# Patient Record
Sex: Male | Born: 1955 | Race: White | Hispanic: No | Smoking: Current every day smoker
Health system: Southern US, Community
[De-identification: ages and names within clinical notes are randomized; demographics above are authoritative.]

## PROBLEM LIST (undated history)

## (undated) DIAGNOSIS — F17200 Nicotine dependence, unspecified, uncomplicated: Secondary | ICD-10-CM

## (undated) DIAGNOSIS — K5792 Diverticulitis of intestine, part unspecified, without perforation or abscess without bleeding: Secondary | ICD-10-CM

## (undated) DIAGNOSIS — Z87442 Personal history of urinary calculi: Secondary | ICD-10-CM

## (undated) DIAGNOSIS — G4730 Sleep apnea, unspecified: Secondary | ICD-10-CM

## (undated) DIAGNOSIS — E7800 Pure hypercholesterolemia, unspecified: Secondary | ICD-10-CM

## (undated) DIAGNOSIS — I10 Essential (primary) hypertension: Secondary | ICD-10-CM

## (undated) DIAGNOSIS — M1990 Unspecified osteoarthritis, unspecified site: Secondary | ICD-10-CM

## (undated) DIAGNOSIS — J449 Chronic obstructive pulmonary disease, unspecified: Secondary | ICD-10-CM

## (undated) HISTORY — PX: TONSILLECTOMY: SUR1361

## (undated) HISTORY — PX: HIP SURGERY: SHX245

## (undated) HISTORY — PX: NASAL SEPTUM SURGERY: SHX37

---

## 2007-10-01 ENCOUNTER — Emergency Department (HOSPITAL_COMMUNITY): Admission: EM | Admit: 2007-10-01 | Discharge: 2007-10-01 | Payer: Self-pay | Admitting: Emergency Medicine

## 2011-03-22 ENCOUNTER — Ambulatory Visit: Payer: Self-pay | Admitting: Family Medicine

## 2012-04-21 ENCOUNTER — Emergency Department (HOSPITAL_BASED_OUTPATIENT_CLINIC_OR_DEPARTMENT_OTHER)
Admission: EM | Admit: 2012-04-21 | Discharge: 2012-04-21 | Disposition: A | Discharge status: Home / Self Care | Payer: Worker's Compensation | Attending: Emergency Medicine | Admitting: Emergency Medicine

## 2012-04-21 ENCOUNTER — Encounter (HOSPITAL_BASED_OUTPATIENT_CLINIC_OR_DEPARTMENT_OTHER): Payer: Self-pay | Admitting: *Deleted

## 2012-04-21 DIAGNOSIS — S0101XA Laceration without foreign body of scalp, initial encounter: Secondary | ICD-10-CM

## 2012-04-21 DIAGNOSIS — IMO0002 Reserved for concepts with insufficient information to code with codable children: Secondary | ICD-10-CM | POA: Insufficient documentation

## 2012-04-21 DIAGNOSIS — Z23 Encounter for immunization: Secondary | ICD-10-CM | POA: Insufficient documentation

## 2012-04-21 DIAGNOSIS — S0100XA Unspecified open wound of scalp, initial encounter: Secondary | ICD-10-CM | POA: Insufficient documentation

## 2012-04-21 MED ORDER — TETANUS-DIPHTH-ACELL PERTUSSIS 5-2.5-18.5 LF-MCG/0.5 IM SUSP
0.5000 mL | Freq: Once | INTRAMUSCULAR | Status: AC
  Administered 2012-04-21: 0.5 mL via INTRAMUSCULAR
  Filled 2012-04-21: qty 0.5

## 2012-04-21 NOTE — ED Notes (Signed)
EMS states patient was at work and hit his head on a gear box door of an airplane.  No loc, no fall.

## 2012-04-21 NOTE — ED Notes (Signed)
Pt states he was walking under airplane and struck head-laceration noted-no LOC

## 2012-04-21 NOTE — ED Provider Notes (Signed)
Medical screening examination/treatment/procedure(s) were performed by non-physician practitioner and as supervising physician I was immediately available for consultation/collaboration.   Gerhard Munch, MD 04/21/12 1904

## 2012-04-21 NOTE — ED Provider Notes (Signed)
History     CSN: [id number]  Arrival date & time 04/21/12  1625   First MD Initiated Contact with Patient 04/21/12 1633      Chief Complaint  Patient presents with  . Head Laceration    (Consider location/radiation/quality/duration/timing/severity/associated sxs/prior treatment) Patient is a 56 y.o. male presenting with scalp laceration. The history is provided by the patient. No language interpreter was used.  Head Laceration This is a new problem. The current episode started today. The problem occurs constantly. Nothing aggravates the symptoms. He has tried nothing for the symptoms. The treatment provided no relief.   Pt complains of a laceration to his scalp.  Pt hit his head on an airplane Past Medical History  Diagnosis Date  . Diabetes mellitus     Past Surgical History  Procedure Date  . Hip surgery   . Tonsillectomy   . Nasal septum surgery     No family history on file.  History  Substance Use Topics  . Smoking status: Current Everyday Smoker  . Smokeless tobacco: Not on file  . Alcohol Use: No      Review of Systems  Skin: Positive for wound.  All other systems reviewed and are negative.    Allergies  Review of patient's allergies indicates no known allergies.  Home Medications  No current outpatient prescriptions on file.  BP 162/87  Pulse 95  Temp 98.7 F (37.1 C) (Oral)  Resp 20  Ht 6\' 1"  (1.854 m)  Wt 328 lb (148.78 kg)  BMI 43.27 kg/m2  SpO2 97%  Physical Exam  Nursing note and vitals reviewed. Constitutional: He is oriented to person, place, and time. He appears well-developed and well-nourished.  HENT:  Right Ear: External ear normal.  Left Ear: External ear normal.  Nose: Nose normal.  Mouth/Throat: Oropharynx is clear and moist.       8cm laceration scalp  Eyes: Conjunctivae and EOM are normal. Pupils are equal, round, and reactive to light.  Neck: Normal range of motion. Neck supple.  Cardiovascular: Normal rate.     Pulmonary/Chest: Effort normal.  Abdominal: Soft.  Neurological: He is alert and oriented to person, place, and time.  Skin: Skin is warm.    ED Course  LACERATION REPAIR Performed by: Elson Areas Authorized by: Elson Areas Consent: Verbal consent not obtained. Risks and benefits: risks, benefits and alternatives were discussed Consent given by: patient Patient understanding: patient states understanding of the procedure being performed Anesthesia: local infiltration Irrigation solution: saline Amount of cleaning: standard Debridement: none Degree of undermining: none Skin closure: staples Number of sutures: 6   (including critical care time)  Labs Reviewed - No data to display No results found.   1. Laceration of scalp       MDM  Pt given tetanus,  I advised staple removal in 8 days        Lonia Skinner [place], [place] 04/21/12 1737

## 2014-11-15 DIAGNOSIS — Z7185 Encounter for immunization safety counseling: Secondary | ICD-10-CM | POA: Insufficient documentation

## 2014-11-15 DIAGNOSIS — Z7189 Other specified counseling: Secondary | ICD-10-CM | POA: Insufficient documentation

## 2016-02-01 ENCOUNTER — Encounter
Admission: RE | Admit: 2016-02-01 | Discharge: 2016-02-01 | Disposition: A | Discharge status: Home / Self Care | Source: Ambulatory Visit | Attending: Orthopedic Surgery | Admitting: Orthopedic Surgery

## 2016-02-01 DIAGNOSIS — Z01812 Encounter for preprocedural laboratory examination: Secondary | ICD-10-CM | POA: Diagnosis present

## 2016-02-01 DIAGNOSIS — I10 Essential (primary) hypertension: Secondary | ICD-10-CM

## 2016-02-01 DIAGNOSIS — M1611 Unilateral primary osteoarthritis, right hip: Secondary | ICD-10-CM | POA: Diagnosis present

## 2016-02-01 DIAGNOSIS — Z01810 Encounter for preprocedural cardiovascular examination: Secondary | ICD-10-CM | POA: Diagnosis present

## 2016-02-01 HISTORY — DX: Unspecified osteoarthritis, unspecified site: M19.90

## 2016-02-01 HISTORY — DX: Essential (primary) hypertension: I10

## 2016-02-01 HISTORY — DX: Sleep apnea, unspecified: G47.30

## 2016-02-01 LAB — URINALYSIS COMPLETE WITH MICROSCOPIC (ARMC ONLY)
BACTERIA UA: NONE SEEN
Bilirubin Urine: NEGATIVE
Glucose, UA: NEGATIVE mg/dL
Hgb urine dipstick: NEGATIVE
Ketones, ur: NEGATIVE mg/dL
Leukocytes, UA: NEGATIVE
NITRITE: NEGATIVE
PROTEIN: NEGATIVE mg/dL
SPECIFIC GRAVITY, URINE: 1.02 (ref 1.005–1.030)
SQUAMOUS EPITHELIAL / LPF: NONE SEEN
pH: 5 (ref 5.0–8.0)

## 2016-02-01 LAB — SURGICAL PCR SCREEN
MRSA, PCR: NEGATIVE
Staphylococcus aureus: NEGATIVE

## 2016-02-01 LAB — SEDIMENTATION RATE: Sed Rate: 20 mm/hr (ref 0–20)

## 2016-02-01 LAB — TYPE AND SCREEN
ABO/RH(D): O POS
ANTIBODY SCREEN: NEGATIVE

## 2016-02-01 LAB — PROTIME-INR
INR: 0.96
Prothrombin Time: 13 seconds (ref 11.4–15.0)

## 2016-02-01 LAB — ABO/RH: ABO/RH(D): O POS

## 2016-02-01 LAB — APTT: APTT: 30 s (ref 24–36)

## 2016-02-01 NOTE — Patient Instructions (Signed)
  Your procedure is scheduled on: 02/14/16 Tues Report to Same Day Surgery 2nd floor medical mall To find out your arrival time please call [phone] between 1PM - 3PM on 02/13/16 Mon  Remember: Instructions that are not followed completely may result in serious medical risk, up to and including death, or upon the discretion of your surgeon and anesthesiologist your surgery may need to be rescheduled.    _x___ 1. Do not eat food or drink liquids after midnight. No gum chewing or hard candies.     __x__ 2. No Alcohol for 24 hours before or after surgery.   ____ 3. Bring all medications with you on the day of surgery if instructed.    __x__ 4. Notify your doctor if there is any change in your medical condition     (cold, fever, infections).     Do not wear jewelry, make-up, hairpins, clips or nail polish.  Do not wear lotions, powders, or perfumes. You may wear deodorant.  Do not shave 48 hours prior to surgery. Men may shave face and neck.  Do not bring valuables to the hospital.    [facility] not responsible for any belongings or valuables.               Contacts, dentures or bridgework may not be worn into surgery.  Leave your suitcase in the car. After surgery it may be brought to your room.  For patients admitted to the hospital, discharge time is determined by your treatment team.   Patients discharged the day of surgery will not be allowed to drive home.    Please read over the following fact sheets that you were given:   [facility] Preparing for Surgery and or MRSA Information   _x___ Take these medicines the morning of surgery with A SIP OF WATER:    1.   2.  3.  4.  5.  6.  ____ Fleet Enema (as directed)   _x___ Use CHG Soap or sage wipes as directed on instruction sheet   ____ Use inhalers on the day of surgery and bring to hospital day of surgery  ____ Stop metformin 2 days prior to surgery    ____ Take 1/2 of usual insulin dose the night before  surgery and none on the morning of           surgery.   ____ Stop aspirin or coumadin, or plavix  _x__ Stop Anti-inflammatories such as Advil, Aleve, Ibuprofen, Motrin, Naproxen,          Naprosyn, Goodies powders or aspirin products. Ok to take Tylenol.   ____ Stop supplements until after surgery.    ____ Bring C-Pap to the hospital.

## 2016-02-03 LAB — URINE CULTURE

## 2016-02-06 NOTE — Pre-Procedure Instructions (Addendum)
URINE CULTURE RESULTS FAXED TO DR MENZ NO RECOLLECTION NEEDED BY DR [facility]

## 2016-02-14 ENCOUNTER — Encounter: Payer: Self-pay | Admitting: *Deleted

## 2016-02-14 ENCOUNTER — Inpatient Hospital Stay: Admitting: Anesthesiology

## 2016-02-14 ENCOUNTER — Encounter
Admission: RE | Disposition: A | Discharge status: Home Health | Payer: Self-pay | Source: Ambulatory Visit | Attending: Orthopedic Surgery

## 2016-02-14 ENCOUNTER — Inpatient Hospital Stay

## 2016-02-14 ENCOUNTER — Inpatient Hospital Stay
Admission: RE | Admit: 2016-02-14 | Discharge: 2016-02-17 | DRG: 470 | Disposition: A | Discharge status: Home Health | Source: Ambulatory Visit | Attending: Orthopedic Surgery | Admitting: Orthopedic Surgery

## 2016-02-14 DIAGNOSIS — F17210 Nicotine dependence, cigarettes, uncomplicated: Secondary | ICD-10-CM | POA: Diagnosis present

## 2016-02-14 DIAGNOSIS — Z888 Allergy status to other drugs, medicaments and biological substances status: Secondary | ICD-10-CM | POA: Diagnosis not present

## 2016-02-14 DIAGNOSIS — Z419 Encounter for procedure for purposes other than remedying health state, unspecified: Secondary | ICD-10-CM

## 2016-02-14 DIAGNOSIS — Z7982 Long term (current) use of aspirin: Secondary | ICD-10-CM

## 2016-02-14 DIAGNOSIS — G4730 Sleep apnea, unspecified: Secondary | ICD-10-CM | POA: Diagnosis present

## 2016-02-14 DIAGNOSIS — M1611 Unilateral primary osteoarthritis, right hip: Secondary | ICD-10-CM | POA: Diagnosis present

## 2016-02-14 DIAGNOSIS — G8918 Other acute postprocedural pain: Secondary | ICD-10-CM

## 2016-02-14 DIAGNOSIS — E669 Obesity, unspecified: Secondary | ICD-10-CM | POA: Diagnosis present

## 2016-02-14 DIAGNOSIS — Z6841 Body Mass Index (BMI) 40.0 and over, adult: Secondary | ICD-10-CM | POA: Diagnosis not present

## 2016-02-14 DIAGNOSIS — Z79899 Other long term (current) drug therapy: Secondary | ICD-10-CM

## 2016-02-14 DIAGNOSIS — I10 Essential (primary) hypertension: Secondary | ICD-10-CM | POA: Diagnosis present

## 2016-02-14 DIAGNOSIS — E119 Type 2 diabetes mellitus without complications: Secondary | ICD-10-CM | POA: Diagnosis present

## 2016-02-14 DIAGNOSIS — D62 Acute posthemorrhagic anemia: Secondary | ICD-10-CM | POA: Diagnosis not present

## 2016-02-14 HISTORY — PX: TOTAL HIP ARTHROPLASTY: SHX124

## 2016-02-14 HISTORY — PX: JOINT REPLACEMENT: SHX530

## 2016-02-14 LAB — CBC
HEMATOCRIT: 37.4 % — AB (ref 40.0–52.0)
HEMOGLOBIN: 12.1 g/dL — AB (ref 13.0–18.0)
MCH: 28.5 pg (ref 26.0–34.0)
MCHC: 32.5 g/dL (ref 32.0–36.0)
MCV: 87.8 fL (ref 80.0–100.0)
Platelets: 265 10*3/uL (ref 150–440)
RBC: 4.26 MIL/uL — ABNORMAL LOW (ref 4.40–5.90)
RDW: 14.5 % (ref 11.5–14.5)
WBC: 15.7 10*3/uL — AB (ref 3.8–10.6)

## 2016-02-14 LAB — GLUCOSE, CAPILLARY: Glucose-Capillary: 94 mg/dL (ref 65–99)

## 2016-02-14 LAB — CREATININE, SERUM: Creatinine, Ser: 0.77 mg/dL (ref 0.61–1.24)

## 2016-02-14 SURGERY — ARTHROPLASTY, HIP, TOTAL, ANTERIOR APPROACH
Anesthesia: Spinal | Site: Hip | Laterality: Right | Wound class: Clean

## 2016-02-14 MED ORDER — TRANEXAMIC ACID 1000 MG/10ML IV SOLN
1500.0000 mg | INTRAVENOUS | Status: AC
  Administered 2016-02-14: 1500 mg via INTRAVENOUS
  Filled 2016-02-14: qty 15

## 2016-02-14 MED ORDER — ONDANSETRON HCL 4 MG/2ML IJ SOLN: 4.0000 mg | Freq: Four times a day (QID) | INTRAMUSCULAR | Status: DC | PRN

## 2016-02-14 MED ORDER — METOCLOPRAMIDE HCL 5 MG/ML IJ SOLN: 5.0000 mg | Freq: Three times a day (TID) | INTRAMUSCULAR | Status: DC | PRN

## 2016-02-14 MED ORDER — DEXTROSE 5 % IV SOLN
3.0000 g | Freq: Four times a day (QID) | INTRAVENOUS | Status: AC
  Administered 2016-02-14 – 2016-02-15 (×3): 3 g via INTRAVENOUS
  Filled 2016-02-14 (×3): qty 3000

## 2016-02-14 MED ORDER — PHENOL 1.4 % MT LIQD
1.0000 | OROMUCOSAL | Status: DC | PRN
  Filled 2016-02-14: qty 177

## 2016-02-14 MED ORDER — METHOCARBAMOL 1000 MG/10ML IJ SOLN
500.0000 mg | Freq: Four times a day (QID) | INTRAMUSCULAR | Status: DC | PRN
  Filled 2016-02-14: qty 5

## 2016-02-14 MED ORDER — LOSARTAN POTASSIUM 50 MG PO TABS
100.0000 mg | ORAL_TABLET | Freq: Every day | ORAL | Status: DC
  Administered 2016-02-14 – 2016-02-16 (×3): 100 mg via ORAL
  Filled 2016-02-14 (×3): qty 2

## 2016-02-14 MED ORDER — MAGNESIUM HYDROXIDE 400 MG/5ML PO SUSP
30.0000 mL | Freq: Every day | ORAL | Status: DC | PRN
  Administered 2016-02-15 – 2016-02-17 (×2): 30 mL via ORAL
  Filled 2016-02-14 (×2): qty 30

## 2016-02-14 MED ORDER — BUPIVACAINE HCL (PF) 0.5 % IJ SOLN
INTRAMUSCULAR | Status: DC | PRN
  Administered 2016-02-14: 3 mL via INTRATHECAL

## 2016-02-14 MED ORDER — PROPOFOL 10 MG/ML IV BOLUS
INTRAVENOUS | Status: DC | PRN
Start: 2016-02-14 — End: 2016-02-14
  Administered 2016-02-14: 90 mg via INTRAVENOUS

## 2016-02-14 MED ORDER — FENTANYL CITRATE (PF) 100 MCG/2ML IJ SOLN: 25.0000 ug | INTRAMUSCULAR | Status: DC | PRN

## 2016-02-14 MED ORDER — ENOXAPARIN SODIUM 40 MG/0.4ML ~~LOC~~ SOLN: 40.0000 mg | SUBCUTANEOUS | Status: DC

## 2016-02-14 MED ORDER — ZOLPIDEM TARTRATE 5 MG PO TABS: 5.0000 mg | ORAL_TABLET | Freq: Every evening | ORAL | Status: DC | PRN

## 2016-02-14 MED ORDER — MIDAZOLAM HCL 5 MG/5ML IJ SOLN
INTRAMUSCULAR | Status: DC | PRN
  Administered 2016-02-14 (×2): 1 mg via INTRAVENOUS

## 2016-02-14 MED ORDER — NEOMYCIN-POLYMYXIN B GU 40-200000 IR SOLN
Status: AC
  Filled 2016-02-14: qty 4

## 2016-02-14 MED ORDER — MORPHINE SULFATE (PF) 2 MG/ML IV SOLN
2.0000 mg | INTRAVENOUS | Status: DC | PRN
  Administered 2016-02-14 (×3): 2 mg via INTRAVENOUS
  Filled 2016-02-14 (×3): qty 1

## 2016-02-14 MED ORDER — OXYCODONE HCL 5 MG PO TABS
5.0000 mg | ORAL_TABLET | ORAL | Status: DC | PRN
  Administered 2016-02-14: 5 mg via ORAL
  Administered 2016-02-14: 10 mg via ORAL
  Administered 2016-02-14: 5 mg via ORAL
  Administered 2016-02-15 – 2016-02-17 (×10): 10 mg via ORAL
  Filled 2016-02-14 (×6): qty 2
  Filled 2016-02-14: qty 1
  Filled 2016-02-14 (×4): qty 2
  Filled 2016-02-14: qty 1
  Filled 2016-02-14: qty 2

## 2016-02-14 MED ORDER — ONDANSETRON HCL 4 MG PO TABS: 4.0000 mg | ORAL_TABLET | Freq: Four times a day (QID) | ORAL | Status: DC | PRN

## 2016-02-14 MED ORDER — DIPHENHYDRAMINE HCL 12.5 MG/5ML PO ELIX
12.5000 mg | ORAL_SOLUTION | ORAL | Status: DC | PRN
  Filled 2016-02-14: qty 10

## 2016-02-14 MED ORDER — EVICEL 5 ML EX KIT
PACK | CUTANEOUS | Status: DC | PRN
  Administered 2016-02-14: 5 mL

## 2016-02-14 MED ORDER — FAMOTIDINE 20 MG PO TABS
20.0000 mg | ORAL_TABLET | Freq: Once | ORAL | Status: AC
  Administered 2016-02-14: 20 mg via ORAL

## 2016-02-14 MED ORDER — CEFAZOLIN SODIUM-DEXTROSE 2-4 GM/100ML-% IV SOLN
INTRAVENOUS | Status: AC
  Filled 2016-02-14: qty 100

## 2016-02-14 MED ORDER — PROPOFOL 500 MG/50ML IV EMUL
INTRAVENOUS | Status: DC | PRN
  Administered 2016-02-14: 100 ug/kg/min via INTRAVENOUS

## 2016-02-14 MED ORDER — BUPIVACAINE-EPINEPHRINE 0.25% -1:200000 IJ SOLN
INTRAMUSCULAR | Status: DC | PRN
  Administered 2016-02-14: 30 mL

## 2016-02-14 MED ORDER — SODIUM CHLORIDE 0.9 % IV SOLN
INTRAVENOUS | Status: DC
  Administered 2016-02-14 (×2): via INTRAVENOUS

## 2016-02-14 MED ORDER — PHENYLEPHRINE HCL 10 MG/ML IJ SOLN
INTRAMUSCULAR | Status: DC | PRN
  Administered 2016-02-14 (×5): 100 ug via INTRAVENOUS

## 2016-02-14 MED ORDER — MENTHOL 3 MG MT LOZG
1.0000 | LOZENGE | OROMUCOSAL | Status: DC | PRN
  Filled 2016-02-14: qty 9

## 2016-02-14 MED ORDER — SODIUM CHLORIDE 0.9 % IV SOLN
INTRAVENOUS | Status: DC
  Administered 2016-02-14 (×2): via INTRAVENOUS

## 2016-02-14 MED ORDER — ONDANSETRON HCL 4 MG/2ML IJ SOLN
4.0000 mg | Freq: Once | INTRAMUSCULAR | Status: DC | PRN
Start: 2016-02-14 — End: 2016-02-14

## 2016-02-14 MED ORDER — EVICEL 5 ML EX KIT
PACK | CUTANEOUS | Status: AC
  Filled 2016-02-14: qty 1

## 2016-02-14 MED ORDER — ACETAMINOPHEN 650 MG RE SUPP: 650.0000 mg | Freq: Four times a day (QID) | RECTAL | Status: DC | PRN

## 2016-02-14 MED ORDER — CEFAZOLIN SODIUM-DEXTROSE 2-4 GM/100ML-% IV SOLN
2.0000 g | Freq: Once | INTRAVENOUS | Status: AC
  Administered 2016-02-14: 2 g via INTRAVENOUS

## 2016-02-14 MED ORDER — METHOCARBAMOL 500 MG PO TABS: 500.0000 mg | ORAL_TABLET | Freq: Four times a day (QID) | ORAL | Status: DC | PRN

## 2016-02-14 MED ORDER — DOCUSATE SODIUM 100 MG PO CAPS
100.0000 mg | ORAL_CAPSULE | Freq: Two times a day (BID) | ORAL | Status: DC
  Administered 2016-02-14 – 2016-02-17 (×8): 100 mg via ORAL
  Filled 2016-02-14 (×8): qty 1

## 2016-02-14 MED ORDER — EPINEPHRINE HCL 1 MG/ML IJ SOLN
INTRAMUSCULAR | Status: DC | PRN
  Administered 2016-02-14: .2 mg via INTRATHECAL

## 2016-02-14 MED ORDER — KETOROLAC TROMETHAMINE 30 MG/ML IJ SOLN
30.0000 mg | Freq: Once | INTRAMUSCULAR | Status: AC
  Administered 2016-02-14: 30 mg via INTRAVENOUS
  Filled 2016-02-14: qty 1

## 2016-02-14 MED ORDER — SODIUM CHLORIDE 0.9 % IV SOLN
1000.0000 mg | Freq: Once | INTRAVENOUS | Status: AC
  Administered 2016-02-14: 1000 mg via INTRAVENOUS
  Filled 2016-02-14: qty 10

## 2016-02-14 MED ORDER — ACETAMINOPHEN 325 MG PO TABS: 650.0000 mg | ORAL_TABLET | Freq: Four times a day (QID) | ORAL | Status: DC | PRN

## 2016-02-14 MED ORDER — BUPIVACAINE-EPINEPHRINE (PF) 0.25% -1:200000 IJ SOLN
INTRAMUSCULAR | Status: AC
  Filled 2016-02-14: qty 30

## 2016-02-14 MED ORDER — BISACODYL 5 MG PO TBEC: 5.0000 mg | DELAYED_RELEASE_TABLET | Freq: Every day | ORAL | Status: DC | PRN

## 2016-02-14 MED ORDER — FAMOTIDINE 20 MG PO TABS
ORAL_TABLET | ORAL | Status: AC
  Filled 2016-02-14: qty 1

## 2016-02-14 MED ORDER — MAGNESIUM CITRATE PO SOLN
1.0000 | Freq: Once | ORAL | Status: DC | PRN
  Filled 2016-02-14: qty 296

## 2016-02-14 MED ORDER — HYDROMORPHONE HCL 1 MG/ML IJ SOLN
1.0000 mg | INTRAMUSCULAR | Status: DC | PRN
  Administered 2016-02-14: 2 mg via INTRAVENOUS
  Filled 2016-02-14 (×2): qty 2

## 2016-02-14 MED ORDER — METOCLOPRAMIDE HCL 10 MG PO TABS: 5.0000 mg | ORAL_TABLET | Freq: Three times a day (TID) | ORAL | Status: DC | PRN

## 2016-02-14 MED ORDER — ASPIRIN-ACETAMINOPHEN-CAFFEINE 250-250-65 MG PO TABS: 2.0000 | ORAL_TABLET | Freq: Four times a day (QID) | ORAL | Status: DC | PRN

## 2016-02-14 MED ORDER — ENOXAPARIN SODIUM 40 MG/0.4ML ~~LOC~~ SOLN
40.0000 mg | Freq: Two times a day (BID) | SUBCUTANEOUS | Status: DC
  Administered 2016-02-15 – 2016-02-17 (×5): 40 mg via SUBCUTANEOUS
  Filled 2016-02-14 (×6): qty 0.4

## 2016-02-14 MED ORDER — NEOMYCIN-POLYMYXIN B GU 40-200000 IR SOLN
Status: DC | PRN
  Administered 2016-02-14: 4 mL

## 2016-02-14 SURGICAL SUPPLY — 46 items
BLADE SAW SAG 18.5X105 (BLADE) ×2 IMPLANT
BNDG COHESIVE 6X5 TAN STRL LF (GAUZE/BANDAGES/DRESSINGS) ×4 IMPLANT
CANISTER SUCT 1200ML W/VALVE (MISCELLANEOUS) ×2 IMPLANT
CAPT HIP TOTAL 3 ×1 IMPLANT
CATH FOL LEG HOLDER (MISCELLANEOUS) ×2 IMPLANT
CATH TRAY METER 16FR LF (MISCELLANEOUS) ×2 IMPLANT
CHLORAPREP W/TINT 26ML (MISCELLANEOUS) ×2 IMPLANT
DRAPE C-ARM XRAY 36X54 (DRAPES) ×2 IMPLANT
DRAPE INCISE IOBAN 66X60 STRL (DRAPES) IMPLANT
DRAPE POUCH INSTRU U-SHP 10X18 (DRAPES) ×2 IMPLANT
DRAPE SHEET LG 3/4 BI-LAMINATE (DRAPES) ×6 IMPLANT
DRAPE STERI IOBAN 125X83 (DRAPES) ×2 IMPLANT
DRAPE TABLE BACK 80X90 (DRAPES) ×2 IMPLANT
DRSG OPSITE POSTOP 4X8 (GAUZE/BANDAGES/DRESSINGS) ×4 IMPLANT
ELECT BLADE 6.5 EXT (BLADE) ×2 IMPLANT
GAUZE SPONGE 4X4 12PLY STRL (GAUZE/BANDAGES/DRESSINGS) ×2 IMPLANT
GLOVE BIOGEL PI IND STRL 9 (GLOVE) ×1 IMPLANT
GLOVE BIOGEL PI INDICATOR 9 (GLOVE) ×1
GLOVE SURG ORTHO 9.0 STRL STRW (GLOVE) ×4 IMPLANT
GOWN STRL REUS W/ TWL LRG LVL3 (GOWN DISPOSABLE) ×1 IMPLANT
GOWN STRL REUS W/TWL LRG LVL3 (GOWN DISPOSABLE) ×2
GOWN SURG XXL (GOWNS) ×2 IMPLANT
HEMOVAC 400CC 10FR (MISCELLANEOUS) ×2 IMPLANT
HOOD PEEL AWAY FLYTE STAYCOOL (MISCELLANEOUS) ×2 IMPLANT
KIT PREVENA INCISION MGT 13 (CANNISTER) ×1 IMPLANT
MAT BLUE FLOOR 46X72 FLO (MISCELLANEOUS) ×2 IMPLANT
NDL SAFETY 18GX1.5 (NEEDLE) ×2 IMPLANT
NDL SPNL 18GX3.5 QUINCKE PK (NEEDLE) ×1 IMPLANT
NEEDLE SPNL 18GX3.5 QUINCKE PK (NEEDLE) ×2 IMPLANT
NS IRRIG 1000ML POUR BTL (IV SOLUTION) ×2 IMPLANT
PACK HIP COMPR (MISCELLANEOUS) ×2 IMPLANT
SOL PREP PVP 2OZ (MISCELLANEOUS) ×2
SOLUTION PREP PVP 2OZ (MISCELLANEOUS) ×1 IMPLANT
STAPLER SKIN PROX 35W (STAPLE) ×2 IMPLANT
STRAP SAFETY BODY (MISCELLANEOUS) ×2 IMPLANT
SUT DVC 2 QUILL PDO  T11 36X36 (SUTURE) ×1
SUT DVC 2 QUILL PDO T11 36X36 (SUTURE) ×1 IMPLANT
SUT DVC QUILL MONODERM 30X30 (SUTURE) ×2 IMPLANT
SUT SILK 0 (SUTURE) ×2
SUT SILK 0 30XBRD TIE 6 (SUTURE) ×1 IMPLANT
SUT VIC AB 1 CT1 36 (SUTURE) ×2 IMPLANT
SYR 20CC LL (SYRINGE) ×2 IMPLANT
SYR 30ML LL (SYRINGE) ×2 IMPLANT
TAPE MICROFOAM 4IN (TAPE) ×2 IMPLANT
TOWEL OR 17X26 4PK STRL BLUE (TOWEL DISPOSABLE) ×2 IMPLANT
TUBE KAMVAC SUCTION (TUBING) ×2 IMPLANT

## 2016-02-14 NOTE — Progress Notes (Signed)
Physical Therapy Treatment Patient Details Name: Jerome Cunningham MRN: [id number] DOB: 15-Oct-1955 Today's Date: 02/14/2016    History of Present Illness Pt underwent R THR anterior approach without reported post-op complications. He is POD#0 at time of initial evaluation    PT Comments    Pt demonstrates heavy assist for bed mobility. Once upright at EOB he is able to perform transfers and limited ambulation with CGA only. Pt ambulates very slow and requires close CGA due to size. Anticipate he will be appropriate to return home at [place] with [facility] PT. Pt will benefit from skilled PT services to address deficits in strength, balance, and mobility in order to return to full function at home.   Follow Up Recommendations  Home health PT     Equipment Recommendations  Rolling walker with 5" wheels    Recommendations for Other Services       Precautions / Restrictions Precautions Precautions: Anterior Hip Precaution Booklet Issued: Yes (comment) Restrictions Weight Bearing Restrictions: Yes RLE Weight Bearing: Weight bearing as tolerated    Mobility  Bed Mobility Overal bed mobility: Needs Assistance Bed Mobility: Supine to Sit     Supine to sit: Mod assist     General bed mobility comments: Pt requires heavy UE support and use of bed rail with assist from therapist to come to sitting.   Transfers Overall transfer level: Needs assistance Equipment used: Rolling walker (2 wheeled) Transfers: Sit to/from Stand Sit to Stand: Min guard         General transfer comment: Pt requires cues for proper hand placement and sequencing. He requires 2 attempts to remain standing. After first attempt pt becomes dizzy. Vitals obtained and are WNL. Pt is steady in standing with UE support  Ambulation/Gait Ambulation/Gait assistance: Min guard Ambulation Distance (Feet): 3 Feet Assistive device: Rolling walker (2 wheeled) Gait Pattern/deviations: Step-to pattern Gait velocity:  Decreased Gait velocity interpretation: Below normal speed for age/gender General Gait Details: Pt able to take short steps from bed to recliner with bariatric rolling walker. He moves very slowly and requires close guarding but no LE buckling noted. Pt reporting high levels of pain limiting his activity at this time   Stairs            Wheelchair Mobility    Modified Rankin (Stroke Patients Only)       Balance Overall balance assessment: Needs assistance Sitting-balance support: No upper extremity supported Sitting balance-Leahy Scale: Good     Standing balance support: Bilateral upper extremity supported Standing balance-Leahy Scale: Poor Standing balance comment: Requires heavy UE support at this time in standing                    Cognition Arousal/Alertness: Awake/alert Behavior During Therapy: WFL for tasks assessed/performed Overall Cognitive Status: Within Functional Limits for tasks assessed                      Exercises Total Joint Exercises Ankle Circles/Pumps: Strengthening;Both;10 reps;Supine Quad Sets: Strengthening;Both;10 reps;Supine Gluteal Sets: Strengthening;Both;10 reps;Supine Heel Slides: Strengthening;Right;10 reps;Supine Hip ABduction/ADduction: Strengthening;Right;10 reps;Supine Straight Leg Raises: Strengthening;Right;10 reps;Supine    General Comments        Pertinent Vitals/Pain Pain Assessment: 0-10 Pain Score: 7  Pain Location: R hip Pain Intervention(s): Monitored during session;Limited activity within patient's tolerance;Premedicated before session    Home Living Family/patient expects to be discharged to:: Private residence Living Arrangements: Spouse/significant other Available Help at Discharge: Family Type of Home: House Home Access: Stairs  to enter Entrance Stairs-Rails: Left Home Layout: One level Home Equipment: Cane - single point;Bedside commode      Prior Function Level of Independence:  Independent      Comments: Independent with ADLs/IADLs   PT Goals (current goals can now be found in the care plan section) Acute Rehab PT Goals Patient Stated Goal: Improve function at home PT Goal Formulation: With patient Time For Goal Achievement: 02/28/16 Potential to Achieve Goals: Good    Frequency  BID    PT Plan      Co-evaluation             End of Session Equipment Utilized During Treatment: Gait belt Activity Tolerance: Patient limited by pain Patient left: in chair;with call bell/phone within reach;with chair alarm set;with SCD's reapplied;Other (comment) (towel roll under heel)     Time: [id number] PT Time Calculation (min) (ACUTE ONLY): 30 min  Charges:  $Therapeutic Exercise: 8-22 mins                    G Codes:      Sharalyn InkJason D Huprich PT, DPT   Huprich,Jason 02/14/2016, 5:27 PM

## 2016-02-14 NOTE — NC FL2 (Signed)
[place] MEDICAID FL2 LEVEL OF CARE SCREENING TOOL     IDENTIFICATION  Patient Name: Jerome Cunningham Birthdate: 02/18/1956 Sex: male Admission Date (Current Location): 02/14/2016  [place]   Facility and Address:  [facility], [address], [place], [place] [address]      Provider Number: [id number]  Attending Physician Name and Address:  Kennedy BuckerMichael Menz, MD  Relative Name and Phone Number:       Current Level of Care: Hospital Recommended Level of Care: Skilled Nursing Facility Prior Approval Number:    Date Approved/Denied:   PASRR Number:  [id number])  Discharge Plan: SNF    Current Diagnoses: Patient Active Problem List   Diagnosis Date Noted  . Primary osteoarthritis of right hip 02/14/2016   Obesity, unspecified    Tobacco dependence    Sleep apnea  on CPAP  Borderline diabetes mellitus    Low testosterone    Osteoarthritis  moderate to severe   Hypertension, essential       Orientation RESPIRATION BLADDER Height & Weight     Self, Time, Situation, Place  Normal Continent Weight:   Height:     BEHAVIORAL SYMPTOMS/MOOD NEUROLOGICAL BOWEL NUTRITION STATUS   (none )  (none) Continent Diet (Diet: Clear Liquid )  AMBULATORY STATUS COMMUNICATION OF NEEDS Skin   Extensive Assist Verbally Surgical wounds, Wound Vac (Incision: Right Hip. )                       Personal Care Assistance Level of Assistance  Bathing, Feeding, Dressing Bathing Assistance: Limited assistance Feeding assistance: Independent Dressing Assistance: Limited assistance     Functional Limitations Info  Sight, Hearing, Speech Sight Info: Adequate Hearing Info: Adequate Speech Info: Adequate    SPECIAL CARE FACTORS FREQUENCY  PT (By licensed PT), OT (By licensed OT)     PT Frequency:  (5) OT Frequency:  (5)            Contractures      Additional Factors Info  Code Status, Allergies Code  Status Info:  (Not on file. ) Allergies Info:  (Tramadol )           Current Medications (02/14/2016):  This is the current hospital active medication list Current Facility-Administered Medications  Medication Dose Route Frequency Provider Last Rate Last Dose  . 0.9 %  sodium chloride infusion   Intravenous Continuous Kennedy BuckerMichael Menz, MD 100 mL/hr at 02/14/16 1140    . acetaminophen (TYLENOL) tablet 650 mg  650 mg Oral Q6H PRN Kennedy BuckerMichael Menz, MD       Or  . acetaminophen (TYLENOL) suppository 650 mg  650 mg Rectal Q6H PRN Kennedy BuckerMichael Menz, MD      . bisacodyl (DULCOLAX) EC tablet 5 mg  5 mg Oral Daily PRN Kennedy BuckerMichael Menz, MD      . ceFAZolin (ANCEF) 2-4 GM/100ML-% IVPB           . ceFAZolin (ANCEF) 3 g in dextrose 5 % 50 mL IVPB  3 g Intravenous Q6H Kennedy BuckerMichael Menz, MD   3 g at 02/14/16 1407  . diphenhydrAMINE (BENADRYL) 12.5 MG/5ML elixir 12.5-25 mg  12.5-25 mg Oral Q4H PRN Kennedy BuckerMichael Menz, MD      . docusate sodium (COLACE) capsule 100 mg  100 mg Oral BID Kennedy BuckerMichael Menz, MD      . Melene Muller[START ON 02/15/2016] enoxaparin (LOVENOX) injection 40 mg  40 mg Subcutaneous Q12H Kennedy BuckerMichael Menz, MD      .  famotidine (PEPCID) 20 MG tablet           . losartan (COZAAR) tablet 100 mg  100 mg Oral QHS Kennedy Bucker, MD      . magnesium citrate solution 1 Bottle  1 Bottle Oral Once PRN Kennedy Bucker, MD      . magnesium hydroxide (MILK OF MAGNESIA) suspension 30 mL  30 mL Oral Daily PRN Kennedy Bucker, MD      . menthol-cetylpyridinium (CEPACOL) lozenge 3 mg  1 lozenge Oral PRN Kennedy Bucker, MD       Or  . phenol (CHLORASEPTIC) mouth spray 1 spray  1 spray Mouth/Throat PRN Kennedy Bucker, MD      . methocarbamol (ROBAXIN) tablet 500 mg  500 mg Oral Q6H PRN Kennedy Bucker, MD       Or  . methocarbamol (ROBAXIN) 500 mg in dextrose 5 % 50 mL IVPB  500 mg Intravenous Q6H PRN Kennedy Bucker, MD      . metoCLOPramide (REGLAN) tablet 5-10 mg  5-10 mg Oral Q8H PRN Kennedy Bucker, MD       Or  . metoCLOPramide (REGLAN) injection 5-10 mg  5-10 mg  Intravenous Q8H PRN Kennedy Bucker, MD      . morphine 2 MG/ML injection 2 mg  2 mg Intravenous Q1H PRN Kennedy Bucker, MD   2 mg at 02/14/16 1533  . ondansetron (ZOFRAN) tablet 4 mg  4 mg Oral Q6H PRN Kennedy Bucker, MD       Or  . ondansetron [facility]) injection 4 mg  4 mg Intravenous Q6H PRN Kennedy Bucker, MD      . oxyCODONE (Oxy IR/ROXICODONE) immediate release tablet 5-10 mg  5-10 mg Oral Q3H PRN Kennedy Bucker, MD   5 mg at 02/14/16 1241  . zolpidem (AMBIEN) tablet 5 mg  5 mg Oral QHS PRN,MR X 1 Kennedy Bucker, MD         Discharge Medications: Please see discharge summary for a list of discharge medications.  Relevant Imaging Results:  Relevant Lab Results:   Additional Information  (SSN: [id number])  Haig Prophet, LCSW

## 2016-02-14 NOTE — Progress Notes (Signed)
Pt. C/o severe pain after receiving morphine. Dr, Joice LoftsPoggi ordered dilaudid 1-2mg  q1h prn and toradol 30mg  once.

## 2016-02-14 NOTE — H&P (Signed)
Reviewed paper H+P, will be scanned into chart. No changes noted.  

## 2016-02-14 NOTE — Transfer of Care (Signed)
Immediate Anesthesia Transfer of Care Note  Patient: Jerome MinaMichael S Cunningham  Procedure(s) Performed: Procedure(s): TOTAL HIP ARTHROPLASTY ANTERIOR APPROACH (Right)  Patient Location: PACU  Anesthesia Type:Spinal  Level of Consciousness: awake, patient cooperative and lethargic  Airway & Oxygen Therapy: Patient Spontanous Breathing and Patient connected to face mask oxygen  Post-op Assessment: Report given to RN and Post -op Vital signs reviewed and stable  Post vital signs: Reviewed and stable  Last Vitals:  Filed Vitals:   02/14/16 0609 02/14/16 0957  BP: 155/80 95/52  Pulse: 78 82  Temp: 36.7 C 36.6 C  Resp: 18 20    Last Pain: There were no vitals filed for this visit.       Complications: No apparent anesthesia complications

## 2016-02-14 NOTE — Progress Notes (Signed)
lovenox increased to 40 q 12 hours starting tomorrow AM due to BMI >40 per protocol.  Olene FlossMelissa D Jannice Beitzel, Pharm.D Clinical Pharmacist

## 2016-02-14 NOTE — Op Note (Signed)
02/14/2016  9:59 AM  PATIENT:  Jerome Cunningham  60 y.o. male  PRE-OPERATIVE DIAGNOSIS:  osteoarthritis right hip  POST-OPERATIVE DIAGNOSIS:  osteoarthritis  PROCEDURE:  Procedure(s): TOTAL HIP ARTHROPLASTY ANTERIOR APPROACH (Right)  SURGEON: Leitha SchullerMichael J Jhoan Schmieder, MD  ASSISTANTS: None  ANESTHESIA:   spinal  EBL:  Total I/O In: 1600 [I.V.:1600] Out: 1525 [Urine:225; Blood:1300]  BLOOD ADMINISTERED:none  DRAINS: (Medium) Hemovact drain(s) in the Subcutaneous layer with  Suction Open   LOCAL MEDICATIONS USED:  MARCAINE     SPECIMEN:  Source of Specimen:  Right femoral head  DISPOSITION OF SPECIMEN:  PATHOLOGY  COUNTS:  YES  TOURNIQUET:  * No tourniquets in log *  IMPLANTS: Medacta AMIS 3 lateralized stem with M 28 mm head,Mpact cup DM 58 mm with liner  DICTATION: .Dragon Dictation   The patient was brought to the operating room and after spinal anesthesia was obtained patient was placed on the operative table with the ipsilateral foot into the Medacta attachment, contralateral leg on a well-padded table. C-arm was brought in and preop template x-ray taken. After prepping and draping in usual sterile fashion appropriate patient identification and timeout procedures were completed. Anterior approach to the hip was obtained and centered over the greater trochanter and TFL muscle. The subcutaneous tissue was incised hemostasis being achieved by electrocautery. TFL fascia was incised and the muscle retracted laterally deep retractor placed. The lateral femoral circumflex vessels were identified and ligated. The anterior capsule was exposed and a capsulotomy performed. The neck was identified and a femoral neck cut carried out with a saw. The head was removed without difficulty and showed sclerotic femoral head and acetabulum. Reaming was carried out to 58 mm and a 58 mm cup trial gave appropriate tightness to the acetabular component a 58 mm Mpact  DM cup was impacted into position. The leg  was then externally rotated and ischiofemoral and pubofemoral releases carried out. The femur was sequentially broached to a size 3, size 3 lateral stem and M head trials were placed and the final components chosen. The 3 lateralized stem was inserted along with a M 28 mm head and 58 mm liner. The hip was reduced and was stable the wound was thoroughly irrigated .  There was significant bleeding throughout the case is difficult to control and Avitene was sprayed in the wound at close the case. Heavy Quill used to close the fascia and then subcutaneous Hemovac drains were placed. Subcuticular Quill suture then used followed by skin staples and appropriate wound VAC over the incision  PLAN OF CARE: Admit to inpatient

## 2016-02-14 NOTE — Anesthesia Preprocedure Evaluation (Signed)
Anesthesia Evaluation  Patient identified by MRN, date of birth, ID band Patient awake    Reviewed: Allergy & Precautions, H&P , NPO status , Patient's Chart, lab work & pertinent test results, reviewed documented beta blocker date and time   Airway Mallampati: III   Neck ROM: full    Dental  (+) Teeth Intact   Pulmonary neg pulmonary ROS, sleep apnea and Continuous Positive Airway Pressure Ventilation , Current Smoker,    Pulmonary exam normal        Cardiovascular hypertension, negative cardio ROS Normal cardiovascular exam Rhythm:regular Rate:Normal     Neuro/Psych negative neurological ROS  negative psych ROS   GI/Hepatic negative GI ROS, Neg liver ROS,   Endo/Other  negative endocrine ROSdiabetes, Well ControlledMorbid obesity  Renal/GU negative Renal ROS  negative genitourinary   Musculoskeletal   Abdominal   Peds  Hematology negative hematology ROS (+)   Anesthesia Other Findings Past Medical History:   Arthritis                                                    Diabetes mellitus                                              Comment:diet control   Hypertension                                                 Sleep apnea                                                Past Surgical History:   HIP SURGERY                                                   TONSILLECTOMY                                                 NASAL SEPTUM SURGERY                                          Reproductive/Obstetrics                             Anesthesia Physical Anesthesia Plan  ASA: III  Anesthesia Plan: Spinal   Post-op Pain Management:    Induction:   Airway Management Planned:   Additional Equipment:   Intra-op Plan:   Post-operative Plan:   Informed Consent: I have reviewed the patients History and Physical, chart, labs and discussed the procedure including the  risks, benefits  and alternatives for the proposed anesthesia with the patient or authorized representative who has indicated his/her understanding and acceptance.   Dental Advisory Given  Plan Discussed with: CRNA  Anesthesia Plan Comments:         Anesthesia Quick Evaluation

## 2016-02-15 LAB — CBC
HEMATOCRIT: 31.4 % — AB (ref 40.0–52.0)
HEMOGLOBIN: 10.6 g/dL — AB (ref 13.0–18.0)
MCH: 29.9 pg (ref 26.0–34.0)
MCHC: 33.9 g/dL (ref 32.0–36.0)
MCV: 88.1 fL (ref 80.0–100.0)
Platelets: 215 10*3/uL (ref 150–440)
RBC: 3.57 MIL/uL — ABNORMAL LOW (ref 4.40–5.90)
RDW: 14.4 % (ref 11.5–14.5)
WBC: 10.5 10*3/uL (ref 3.8–10.6)

## 2016-02-15 LAB — BASIC METABOLIC PANEL
Anion gap: 4 — ABNORMAL LOW (ref 5–15)
BUN: 18 mg/dL (ref 6–20)
CALCIUM: 8.2 mg/dL — AB (ref 8.9–10.3)
CHLORIDE: 107 mmol/L (ref 101–111)
CO2: 25 mmol/L (ref 22–32)
CREATININE: 0.83 mg/dL (ref 0.61–1.24)
GFR calc Af Amer: >60 mL/min (ref >60)
GFR calc non Af Amer: >60 mL/min (ref >60)
GLUCOSE: 118 mg/dL — AB (ref 65–99)
Potassium: 3.7 mmol/L (ref 3.5–5.1)
Sodium: 136 mmol/L (ref 135–145)

## 2016-02-15 MED ORDER — FE FUMARATE-B12-VIT C-FA-IFC PO CAPS
1.0000 | ORAL_CAPSULE | Freq: Three times a day (TID) | ORAL | Status: DC
  Administered 2016-02-15 – 2016-02-17 (×6): 1 via ORAL
  Filled 2016-02-15 (×9): qty 1

## 2016-02-15 NOTE — Progress Notes (Signed)
Physical Therapy Treatment Patient Details Name: Raina MinaMichael S Baltzell MRN: [id number] DOB: 04/27/1956 Today's Date: 02/15/2016    History of Present Illness Pt is a 60 y.o. male who underwent R THR anterior approach without reported post-op complications.     PT Comments    Pt is making very slow progress and it is unclear whether he will be safe to discharge home at DC. Will continue to update DC recommendations with progress but currently he would be unable to perform stairs or navigate functional household distance. He takes exceedingly long to ambulate short distances this afternoon. He has considerable R hip flexion weakness and pain resulting in abnormal gait pattern. Pt requiring modA+1 for bed mobility. He is able to complete all exercises but reports increase in R hip pain.   Follow Up Recommendations  SNF      Equipment Recommendations  Rolling walker with 5" wheels (Bariatric)    Recommendations for Other Services       Precautions / Restrictions Precautions Precautions: Anterior Hip Precaution Booklet Issued: Yes (comment) Restrictions Weight Bearing Restrictions: Yes RLE Weight Bearing: Weight bearing as tolerated    Mobility  Bed Mobility Overal bed mobility: Needs Assistance Bed Mobility: Sit to Supine       Sit to supine: Mod assist   General bed mobility comments: Pt is still very weak with bed mobility. Attempted to return to bed on L side however no better than mobility to the R side. Pt provided cues and technique for using towel/belt to assist RLE into bed as well as utilizing LLE. Unable to perform without moderate assist from therapist. Pt has a recliner he can sleep in but he requires considerable assist for bed mobility  Transfers Overall transfer level: Needs assistance Equipment used: Rolling walker (2 wheeled) Transfers: Sit to/from Stand Sit to Stand: Min guard         General transfer comment: Pt continues to demonstrate improvement in  speed but still restricts weight shift to RLE although improved from POD#0. Once upright he is steady in standing with unilateral UE assist  Ambulation/Gait Ambulation/Gait assistance: Min guard Ambulation Distance (Feet): 80 Feet Assistive device: Rolling walker (2 wheeled) Gait Pattern/deviations: Step-to pattern;Antalgic Gait velocity: Decreased Gait velocity interpretation: <1.8 ft/sec, indicative of risk for recurrent falls General Gait Details: Gait is very slow this afternoon. Pt with significant R hip flexion weakness frequently vaulting off LLE and hiking R hip in order to clear foot from floor. Heavy cues provided for posture, proper sequencing with walker, and decreased UE reliance. Pt requiring frequent rest breaks and HR increases to 115. SaO2>90% on room air. Pt requires 90-120 seconds for each 10' segment of walk not counting standing rest breaks between bouts.   Stairs            Wheelchair Mobility    Modified Rankin (Stroke Patients Only)       Balance Overall balance assessment: Needs assistance Sitting-balance support: No upper extremity supported Sitting balance-Leahy Scale: Good     Standing balance support: Bilateral upper extremity supported Standing balance-Leahy Scale: Fair                      Cognition Arousal/Alertness: Awake/alert Behavior During Therapy: WFL for tasks assessed/performed Overall Cognitive Status: Within Functional Limits for tasks assessed                      Exercises Total Joint Exercises Hip ABduction/ADduction: Strengthening;10 reps;Seated;Both Long Arc Quad: Strengthening;Right;10  reps;Seated Knee Flexion: Strengthening;Right;10 reps;Seated Marching in Standing: Strengthening;Both;10 reps;Seated    General Comments        Pertinent Vitals/Pain Pain Assessment: 0-10 Pain Score: 3  Pain Location: L hip Pain Descriptors / Indicators: Sharp Pain Intervention(s): Limited activity within patient's  tolerance;Monitored during session    Home Living Family/patient expects to be discharged to:: Private residence Living Arrangements: Spouse/significant other Available Help at Discharge: Family Type of Home: House Home Access: Stairs to enter Entrance Stairs-Rails: Left Home Layout: One level Home Equipment: Cane - single point;Bedside commode      Prior Function Level of Independence: Independent      Comments: Independent with ADLs/IADLs   PT Goals (current goals can now be found in the care plan section) Acute Rehab PT Goals Patient Stated Goal: Improve function at home PT Goal Formulation: With patient Time For Goal Achievement: 02/28/16 Potential to Achieve Goals: Good Progress towards PT goals: Progressing toward goals (Progress is very slow)    Frequency  BID    PT Plan Current plan remains appropriate    Co-evaluation             End of Session Equipment Utilized During Treatment: Gait belt Activity Tolerance: Patient limited by pain Patient left: with SCD's reapplied;Other (comment);in bed;with bed alarm set;with call bell/phone within reach (ice pack on hip, towel roll under heel)     Time: 1610-9604 PT Time Calculation (min) (ACUTE ONLY): 46 min  Charges:  $Gait Training: 23-37 mins $Therapeutic Exercise: 8-22 mins                    G Codes:      Sharalyn Ink Huprich PT, DPT   Huprich,Jason 02/15/2016, 3:29 PM

## 2016-02-15 NOTE — Progress Notes (Signed)
SNF Benefits:  Number called: [phone] Rep: Fabio Asa Reference Number: Fabio Asa 02/15/16  Active [employer] coverage with a $150 individual deductible, which has been satisfied for calendar year.  Out of pocket max is $3000, of which $579.54 met so far.  In-network SNF: patient responsible for either $250 copay per day OR 20% co-insurance, plus 20% co-insurance for professional fees, whichever amount is the lesser of the two.  No visit limit for days as length of stay determined by medical necessity.  Auth or referral is not required.  Follows Medicare guidelines of 3 consectutive day hospital inpatient stay and being admitted to SNF within 30 days of hospital discharge in order to qualify for service.

## 2016-02-15 NOTE — Progress Notes (Signed)
Physical Therapy Treatment Patient Details Name: Jerome Cunningham MRN: [id number] DOB: 09/26/1955 Today's Date: 02/15/2016    History of Present Illness Pt underwent R THR anterior approach without reported post-op complications. He is POD#0 at time of initial evaluation    PT Comments    Pt continues to require considerable assist with bed mobility. Will practice exiting bed on L side this afternoon to see if it is easier for him. Once at EOB he is able to perform transfers and ambulation with CGA only. Ambulation is still very slow with patient reporting increased RLE pain during gait. He is able to complete all supine exercises but requires assist for SLR and reports increased pain with at least 50% of exercises. Will continue to progress ambulation distance this afternoon to work toward goal of returning home with [facility] PT at discharge. Pt will benefit from skilled PT services to address deficits in strength, balance, and mobility in order to return to full function at home.    Follow Up Recommendations  Home health PT     Equipment Recommendations  Rolling walker with 5" wheels    Recommendations for Other Services       Precautions / Restrictions Precautions Precautions: Anterior Hip Precaution Booklet Issued: Yes (comment) Restrictions Weight Bearing Restrictions: Yes RLE Weight Bearing: Weight bearing as tolerated    Mobility  Bed Mobility Overal bed mobility: Needs Assistance Bed Mobility: Supine to Sit     Supine to sit: Mod assist     General bed mobility comments: Pt demonstrates heavy assist to come to sitting from therapist as well as heavy use of bed rails  Transfers Overall transfer level: Needs assistance Equipment used: Rolling walker (2 wheeled) Transfers: Sit to/from Stand Sit to Stand: Min guard         General transfer comment: Pt demonstrates improving LE strength with tarnsfers. Increased weight shift to RLE. Pt continues to demonstrate heavy  UE use  Ambulation/Gait Ambulation/Gait assistance: Min guard Ambulation Distance (Feet): 60 Feet Assistive device: Rolling walker (2 wheeled) Gait Pattern/deviations: Step-to pattern Gait velocity: Decreased   General Gait Details: Pt provided cues for upright posture, decreased UE reliance, and improved L step length. Pt with heavy UE use for ambulation. Fatigue monitored durign ambulation. Vitals remain WNL throughout. Pt unable to ambulate farther at this time   Stairs            Wheelchair Mobility    Modified Rankin (Stroke Patients Only)       Balance Overall balance assessment: Needs assistance Sitting-balance support: No upper extremity supported Sitting balance-Leahy Scale: Good     Standing balance support: Bilateral upper extremity supported Standing balance-Leahy Scale: Fair                      Cognition Arousal/Alertness: Awake/alert Behavior During Therapy: WFL for tasks assessed/performed Overall Cognitive Status: Within Functional Limits for tasks assessed                      Exercises Total Joint Exercises Ankle Circles/Pumps: Strengthening;Both;10 reps;Supine Quad Sets: Strengthening;Both;10 reps;Supine Gluteal Sets: Strengthening;Both;10 reps;Supine Short Arc Quad: Strengthening;Right;10 reps;Supine Heel Slides: Strengthening;Right;10 reps;Supine Hip ABduction/ADduction: Strengthening;Right;10 reps;Supine Straight Leg Raises: Strengthening;Right;10 reps;Supine    General Comments        Pertinent Vitals/Pain Pain Assessment: 0-10 Pain Score: 6  Pain Location: L hip Pain Descriptors / Indicators: Burning Pain Intervention(s): Monitored during session;Patient requesting pain meds-RN notified    Home Living  Prior Function            PT Goals (current goals can now be found in the care plan section) Acute Rehab PT Goals Patient Stated Goal: Improve function at home PT Goal  Formulation: With patient Time For Goal Achievement: 02/28/16 Potential to Achieve Goals: Good Progress towards PT goals: Progressing toward goals    Frequency  BID    PT Plan Current plan remains appropriate    Co-evaluation             End of Session Equipment Utilized During Treatment: Gait belt Activity Tolerance: Patient limited by pain Patient left: in chair;with call bell/phone within reach;with chair alarm set;with SCD's reapplied;Other (comment) (pillow under RLE, ice pack on hip)     Time: 0900-0930 PT Time Calculation (min) (ACUTE ONLY): 30 min  Charges:  $Gait Training: 8-22 mins $Therapeutic Exercise: 8-22 mins                    G Codes:      Sharalyn InkJason D Vonda Harth PT, DPT   Rosie Golson 02/15/2016, 10:37 AM

## 2016-02-15 NOTE — Care Management Note (Addendum)
Case Management Note  Patient Details  Name: Jerome Cunningham MRN: [id number] Date of Birth: [birth year]  Subjective/Objective:                  Met with patient to discuss discharge planning. Patient plans to return home at discharge. He states his wife will be able to assist him. Per PT patient will need a bariatric rolling walker. He has a bedside commode. He is undecided which home health agency he would like. His PCP is Dr. Netty Starring. He use [facility] [phone] for Rx .  Action/Plan: Home health list left with patient.. [facility]ric walk[facility]. Lovenox 78m #14 called into [facility] for price. I will follow up with patient regarding home health agency.   Expected Discharge Date:                  Expected Discharge Plan:     In-House Referral:     Discharge planning Services  CM Consult  Post Acute Care Choice:  Durable Medical Equipment, Home Health Choice offered to:  Patient  DME Arranged:  Walker rolling (bariatric ) [place] [facility]  [place] [place]  [facility] [place] Status of Service:  In process, will continue to follow  Medicare Important Message Given:    Date Medicare IM Given:    Medicare IM give by:    Date Additional Medicare IM Given:    Additional Medicare Important Message give by:     If discussed at LS[place]of Stay Meetings, dates discussed:    Additional Comments:  AMarshell Garfinkel RN 02/15/2016, 11:17 AM

## 2016-02-15 NOTE — Progress Notes (Signed)
Foley d/c'd at 0520 with 50cc urine output

## 2016-02-15 NOTE — Clinical Social Work Note (Signed)
Clinical Social Work Assessment  Patient Details  Name: Jerome Cunningham MRN: [id number] Date of Birth: 10/17/1955  Date of referral:  02/15/16               Reason for consult:  Facility Placement                Permission sought to share information with:  Chartered certified accountant granted to share information::  Yes, Verbal Permission Granted  Name::      [place]::   [place]   Relationship::     Contact Information:     Housing/Transportation Living arrangements for the past 2 months:  [place] of Information:  Patient Patient Interpreter Needed:  None Criminal Activity/Legal Involvement Pertinent to Current Situation/Hospitalization:  No - Comment as needed Significant Relationships:  Spouse Lives with:  Spouse Do you feel safe going back to the place where you live?  Yes Need for family participation in patient care:  Yes (Comment)  Care giving concerns:  Patient lives in [place] with his wife Vaughan Basta.    Social Worker assessment / plan:  PT changed recommendation from home health to SNF this afternoon. Clinical Education officer, museum (CSW) met with patient alone at bedside. CSW introduced self and explained role of CSW department. Patient reported that he lives in [place] with is wife Vaughan Basta. CSW explained that PT is now recommending SNF. Patient is agreeable to SNF as a back up plan to home health. Patient is agreeable to SNF search however he still prefers to go home.   FL2 complete and faxed out. CSW will continue to follow and assist as needed.   Employment status:  Disabled (Comment on whether or not currently receiving Disability), Retired Insurance underwriter information:  Other (Comment Required) Sports administrator ) PT Recommendations:  [place] / Referral to community resources:  [place]  Patient/Family's Response to care:  Patient is agreeable to SNF search however he prefers  to go home with home health.   Patient/Family's Understanding of and Emotional Response to Diagnosis, Current Treatment, and Prognosis:  Patient was pleasant and thanked CSW for visit.   Emotional Assessment Appearance:  Appears stated age Attitude/Demeanor/Rapport:    Affect (typically observed):  Accepting, Adaptable, Pleasant Orientation:  Oriented to Self, Oriented to Place, Oriented to  Time, Oriented to Situation Alcohol / Substance use:  Not Applicable Psych involvement (Current and /or in the community):  No (Comment)  Discharge Needs  Concerns to be addressed:  Discharge Planning Concerns Readmission within the last 30 days:  No Current discharge risk:  Dependent with Mobility Barriers to Discharge:  Continued Medical Work up   Elwyn Reach 02/15/2016, 3:43 PM

## 2016-02-15 NOTE — Care Management (Addendum)
[facility] has [facility]. [facility] Lovenox $10

## 2016-02-15 NOTE — Progress Notes (Signed)
Clinical Social Worker (CSW) received SNF consult. PT is recommending home health. RN Case Manager is aware of above. Please reconsult if future social work needs arise. CSW signing off.   Garry Bochicchio Morgan, LCSW [phone] 

## 2016-02-15 NOTE — Evaluation (Signed)
Occupational Therapy Evaluation Patient Details Name: Jerome MinaMichael S Cunningham MRN: [id number] DOB: 02/17/1956 Today's Date: 02/15/2016    History of Present Illness Pt  is a 60 y.o. male who underwent R THR anterior approach without reported post-op complications.    Clinical Impression   Pt. Is a 60 y.o. male who [facility] right [facility]R(Anterior approach). Pt. Presents with pain, limited ROM, weakness, limited activity tolerance, and impaired functional mobility for ADLs. Pt. Could benefit from skilled OT services for ADL and A/E retraining, and functional mobility for ADLs in order to improve overall ADL functioning, and return to her PLOF.    Follow Up Recommendations  Home health OT    Equipment Recommendations       Recommendations for Other Services PT consult     Precautions / Restrictions Precautions Precautions: Anterior Hip Precaution Booklet Issued: Yes (comment) Restrictions Weight Bearing Restrictions: Yes RLE Weight Bearing: Weight bearing as tolerated      Mobility Bed Mobility  Transfers   Balance                            ADL Overall ADL's : Needs assistance/impaired Eating/Feeding: Set up   Grooming: Set up               Lower Body Dressing: Maximal assistance               Functional mobility during ADLs: Min guard       Vision     Perception     Praxis      Pertinent Vitals/Pain Pain Assessment: 0-10 Pain Score: 6  Pain Location: Left Hip Pain Descriptors / Indicators: Burning Pain Intervention(s): Monitored during session;Patient requesting pain meds-RN notified     Hand Dominance Right   Extremity/Trunk Assessment Upper Extremity Assessment Upper Extremity Assessment: Overall WFL for tasks assessed           Communication Communication Communication: No difficulties   Cognition Arousal/Alertness: Awake/alert Behavior During Therapy: WFL for tasks assessed/performed Overall Cognitive  Status: Within Functional Limits for tasks assessed                     General Comments       Exercises Exercises: Total Joint     Shoulder Instructions      Home Living Family/patient expects to be discharged to:: Private residence Living Arrangements: Spouse/significant other Available Help at Discharge: Family Type of Home: House Home Access: Stairs to enter   Entrance Stairs-Rails: Left Home Layout: One level     Bathroom Shower/Tub: Chief Strategy OfficerTub/shower unit   Bathroom Toilet: Standard     Home Equipment: Cane - single point;Bedside commode          Prior Functioning/Environment Level of Independence: Independent        Comments: Independent with ADLs/IADLs    OT Diagnosis: Generalized weakness;Acute pain   OT Problem List: Decreased strength;Decreased range of motion;Pain;Decreased activity tolerance   OT Treatment/Interventions: Self-care/ADL training;Therapeutic activities;Therapeutic exercise;Patient/family education;DME and/or AE instruction    OT Goals(Current goals can be found in the care plan section) Acute Rehab OT Goals Patient Stated Goal: To regain independence OT Goal Formulation: With patient Time For Goal Achievement: 02/22/16 Potential to Achieve Goals: Good  OT Frequency: Min 1X/week   Barriers to D/C:            Co-evaluation  End of Session    Activity Tolerance: Patient tolerated treatment well Patient left: in chair;with call bell/phone within reach;with chair alarm set   Time: 1010-1040 OT Time Calculation (min): 30 min Charges:  OT General Charges $OT Visit: 1 Procedure OT Evaluation $OT Eval Moderate Complexity: 1 Procedure OT Treatments $Self Care/Home Management : 8-22 mins G-Codes:    Olegario Messier, MS, OTR/L Olegario Messier 02/15/2016, 11:37 AM

## 2016-02-15 NOTE — Anesthesia Postprocedure Evaluation (Signed)
Anesthesia Post Note  Patient: Jerome MinaMichael S Cunningham  Procedure(s) Performed: Procedure(s) (LRB): TOTAL HIP ARTHROPLASTY ANTERIOR APPROACH (Right)  Patient location during evaluation: Nursing Unit Anesthesia Type: Spinal Level of consciousness: awake, awake and alert and oriented Pain management: pain level controlled Vital Signs Assessment: post-procedure vital signs reviewed and stable Respiratory status: spontaneous breathing, nonlabored ventilation and respiratory function stable Cardiovascular status: blood pressure returned to baseline Postop Assessment: no headache, no backache, patient able to bend at knees and no signs of nausea or vomiting Anesthetic complications: no    Last Vitals:  Filed Vitals:   02/14/16 2014 02/15/16 0501  BP: 128/68 113/67  Pulse: 94 88  Temp: 36.9 C   Resp: 20     Last Pain:  Filed Vitals:   02/15/16 [id number]  PainSc: 2                  Ginger CarneStephanie Emmalyn Hinson

## 2016-02-15 NOTE — Progress Notes (Signed)
   Subjective: 1 Day Post-Op Procedure(s) (LRB): TOTAL HIP ARTHROPLASTY ANTERIOR APPROACH (Right) Patient reports pain as moderate.   Patient is well, and has had no acute complaints or problems Denies any CP, SOB, ABD pain. We will continue therapy today.  Plan is to go Home after hospital stay.  Objective: Vital signs in last 24 hours: Temp:  [97.1 F (36.2 C)-98.4 F (36.9 C)] 98.4 F (36.9 C) (05/23 2014) Pulse Rate:  [65-94] 88 (05/24 0501) Resp:  [13-20] 20 (05/23 2014) BP: (94-128)/(52-71) 113/67 mmHg (05/24 0501) SpO2:  [92 %-100 %] 98 % (05/24 0501)  Intake/Output from previous day: 05/23 0701 - 05/24 0700 In: 3811.7 [P.O.:240; I.V.:3571.7] Out: 3830 [Urine:2500; Drains:30; Blood:1300] Intake/Output this shift: Total I/O In: 0  Out: 1000 [Urine:1000]   Recent Labs  02/14/16 1217 02/15/16 0423  HGB 12.1* 10.6*    Recent Labs  02/14/16 1217 02/15/16 0423  WBC 15.7* 10.5  RBC 4.26* 3.57*  HCT 37.4* 31.4*  PLT 265 215    Recent Labs  02/14/16 1217 02/15/16 0423  NA  --  136  K  --  3.7  CL  --  107  CO2  --  25  BUN  --  18  CREATININE 0.77 0.83  GLUCOSE  --  118*  CALCIUM  --  8.2*   No results for input(s): LABPT, INR in the last 72 hours.  EXAM General - Patient is Alert, Appropriate and Oriented Extremity - Neurovascular intact Sensation intact distally Intact pulses distally Dorsiflexion/Plantar flexion intact No cellulitis present Dressing - dressing C/D/I and no drainage, hemovac and wound vac intact Motor Function - intact, moving foot and toes well on exam.   Past Medical History  Diagnosis Date  . Arthritis   . Diabetes mellitus     diet control  . Hypertension   . Sleep apnea     Assessment/Plan:   1 Day Post-Op Procedure(s) (LRB): TOTAL HIP ARTHROPLASTY ANTERIOR APPROACH (Right) Active Problems:   Primary osteoarthritis of right hip   Acute post op blood loss anemia    Estimated body mass index is 43.28  kg/(m^2) as calculated from the following:   Height as of 02/01/16: 6\' 1"  (1.854 m).   Weight as of 04/21/12: 148.78 kg (328 lb). Advance diet Up with therapy  Needs BM Recheck labs in the am  DVT Prophylaxis - Lovenox, Foot Pumps and TED hose Weight-Bearing as tolerated to right leg D/C O2 and Pulse OX and try on Room Air  T. [place] Neighborhris Libbie Bartley, PA-C [facility] 02/15/2016, 8:16 AM

## 2016-02-15 NOTE — Clinical Social Work Placement (Signed)
   CLINICAL SOCIAL WORK PLACEMENT  NOTE  Date:  02/15/2016  Patient Details  Name: Raina MinaMichael S Morre MRN: [id number] Date of Birth: 01/03/1956  Clinical Social Work is seeking post-discharge placement for this patient at the Skilled  Nursing Facility level of care (*CSW will initial, date and re-position this form in  chart as items are completed):  Yes   Patient/family provided with [facility] Clinical Social Work Department's list of facilities offering this level of care within the geographic area requested by the patient (or if unable, by the patient's family).  Yes   Patient/family informed of their freedom to choose among providers that offer the needed level of care, that participate in Medicare, Medicaid or managed care program needed by the patient, have an available bed and are willing to accept the patient.  Yes   Patient/family informed of [facility]'s ownership interest in [facility] and [facility], as well as of the fact that they are under no obligation to receive care at these facilities.  PASRR submitted to EDS on 02/15/16     PASRR number received on 02/15/16     Existing PASRR number confirmed on       FL2 transmitted to all facilities in geographic area requested by pt/family on 02/15/16     FL2 transmitted to all facilities within larger geographic area on       Patient informed that his/her managed care company has contracts with or will negotiate with certain facilities, including the following:            Patient/family informed of bed offers received.  Patient chooses bed at       Physician recommends and patient chooses bed at      Patient to be transferred to   on  .  Patient to be transferred to facility by       Patient family notified on   of transfer.  Name of family member notified:        PHYSICIAN       Additional Comment:    _______________________________________________ Haig ProphetMorgan, Kaileia Flow G, LCSW 02/15/2016, 3:41 PM

## 2016-02-16 LAB — HEMOGLOBIN: HEMOGLOBIN: 9.8 g/dL — AB (ref 13.0–18.0)

## 2016-02-16 NOTE — Care Management (Signed)
Patient wants to go home with home health at time of discharge. RNCM will not cancel home health or Lovenox until it is determined what patient will need.

## 2016-02-16 NOTE — Progress Notes (Signed)
Occupational Therapy Treatment Patient Details Name: Jerome MinaMichael S Lemire MRN: [id number] DOB: 01/07/1956 Today's Date: 02/16/2016    History of present illness Pt is a 60 y.o. male who underwent R THR anterior approach without reported post-op complications.    OT comments  Pt seen after PT and at EOB for session and then assisted back to bed.  Pt required max assist to get RLE up onto bed and given theraband to assist with this. Educated in AE for dressing skills and rec reacher, sock aid, and he has a LH shoe horn he has been using at home.  Rec sitting EOB or in chair for safety and purchase a transfer tub bench when showering to prevent falls trying to step up and over tub.  Continue dressing tech tomorrow and try a leg lifter if theraband does not work for getting leg up into bed.  Concerned about how heavy his leg is and his wife being able to assist each time he gets into bed.  He does have an adjustable bed at home which will help.  Continue to rec OT HH as well.    Follow Up Recommendations  Home health OT    Equipment Recommendations   (rec elastic shoe laces, transfer tub bench, reacher and leg lifter)    Recommendations for Other Services      Precautions / Restrictions Precautions Precautions: Anterior Hip Precaution Booklet Issued: Yes (comment) Restrictions Weight Bearing Restrictions: Yes RLE Weight Bearing: Weight bearing as tolerated       Mobility Bed Mobility Overal bed mobility:  (Significant increased time/effort/rails; ultimately performs)       Supine to sit: Modified independent (Device/Increase time)     General bed mobility comments: Not tested; returned to edge of bed for OT to work with  Transfers Overall transfer level: Needs assistance Equipment used: Rolling walker (2 wheeled) Transfers: Sit to/from Stand (from recliner and w/c 2x) Sit to Stand: Min assist         General transfer comment: Improving especially to stand; mild cues given  for placement of RLE with sit    Balance Overall balance assessment: Needs assistance Sitting-balance support: Feet supported Sitting balance-Leahy Scale: Normal     Standing balance support: Bilateral upper extremity supported Standing balance-Leahy Scale: Fair                     ADL Overall ADL's : Needs assistance/impaired                                       General ADL Comments: education about AE and rec elastic shoe laces and given theraband to assist RLE up onto bed when getting in and out of bed.  May benefit from a leg lifter since leg is heavy and trunk flexion is limited due to pain.      Vision                     Perception     Praxis      Cognition   Behavior During Therapy: [facility] for tasks assessed/performed Overall Cognitive Status: Within Functional Limits for tasks assessed                       Extremity/Trunk Assessment               Exercises Total Joint Exercises Ankle Circles/Pumps:  AROM;Both;20 reps (long sit) Quad Sets: Strengthening;Both;20 reps (long sit) Gluteal Sets: Strengthening;Both;20 reps (long sit) Towel Squeeze: Strengthening;Both;20 reps (long sit) Heel Slides: AAROM;Right;10 reps (long sit) Marching in Standing: Strengthening;Right;10 reps;Standing (2 sets)   Shoulder Instructions       General Comments      Pertinent Vitals/ Pain       Pain Assessment: 0-10 Pain Score: 8  Pain Location: L hip Pain Descriptors / Indicators: Burning;Pins and needles Pain Intervention(s): Limited activity within patient's tolerance;Monitored during session;Premedicated before session  Home Living                                          Prior Functioning/Environment              Frequency Min 1X/week     Progress Toward Goals  OT Goals(current goals can now be found in the care plan section)  Progress towards OT goals: Progressing toward goals  Acute Rehab OT  Goals Patient Stated Goal: Improve function at home OT Goal Formulation: With patient Time For Goal Achievement: 02/22/16 Potential to Achieve Goals: Good  Plan Discharge plan remains appropriate    Co-evaluation                 End of Session     Activity Tolerance Patient limited by pain   Patient Left in bed;with bed alarm set;with nursing/sitter in room;with call bell/phone within reach;with SCD's reapplied   Nurse Communication          Time: 1478-2956 OT Time Calculation (min): 28 min  Charges: OT General Charges $OT Visit: 1 Procedure OT Treatments $Self Care/Home Management : 23-37 mins   Susanne Borders, OTR/L ascom [phone] 02/16/2016, 4:48 PM

## 2016-02-16 NOTE — Progress Notes (Signed)
Clinical Social Worker (CSW) met with patient and presented bed offer. Patient has only 1 bed offer from [facility]. CSW also made patient aware that he is responsible for either $250 copay per day OR 20% co-insurance at [facility]. Patient reported that he will accept the bed offer and pay the co-pays if he has to but he would rather go home. [place] admissions coordinator at [facility] is aware of accepted bed offer. PT will work with patient today and see if he can progress to home health. CSW will continue to follow and assist as needed.   Blima Rich, LCSW [phone]

## 2016-02-16 NOTE — Progress Notes (Signed)
   Subjective: 2 Days Post-Op Procedure(s) (LRB): TOTAL HIP ARTHROPLASTY ANTERIOR APPROACH (Right) Patient reports pain as moderate.   Patient is well, and has had no acute complaints or problems Denies any CP, SOB, ABD pain. We will continue therapy today.  Plan is to go Home after hospital stay.  Objective: Vital signs in last 24 hours: Temp:  [98.2 F (36.8 C)-99.3 F (37.4 C)] 99.3 F (37.4 C) (05/25 0333) Pulse Rate:  [78-109] 97 (05/25 0333) Resp:  [18] 18 (05/25 0333) BP: (112-152)/(58-89) 112/58 mmHg (05/25 0333) SpO2:  [93 %-98 %] 93 % (05/25 0333)  Intake/Output from previous day: 05/24 0701 - 05/25 0700 In: 960 [P.O.:960] Out: 2945 [Urine:2875; Drains:70] Intake/Output this shift: Total I/O In: 2630 [I.V.:2630] Out: -    Recent Labs  02/14/16 1217 02/15/16 0423 02/16/16 0409  HGB 12.1* 10.6* 9.8*    Recent Labs  02/14/16 1217 02/15/16 0423  WBC 15.7* 10.5  RBC 4.26* 3.57*  HCT 37.4* 31.4*  PLT 265 215    Recent Labs  02/14/16 1217 02/15/16 0423  NA  --  136  K  --  3.7  CL  --  107  CO2  --  25  BUN  --  18  CREATININE 0.77 0.83  GLUCOSE  --  118*  CALCIUM  --  8.2*   No results for input(s): LABPT, INR in the last 72 hours.  EXAM General - Patient is Alert, Appropriate and Oriented Extremity - Neurovascular intact Sensation intact distally Intact pulses distally Dorsiflexion/Plantar flexion intact No cellulitis present Dressing - dressing C/D/I and no drainage,  wound vac intact Motor Function - intact, moving foot and toes well on exam.   Past Medical History  Diagnosis Date  . Arthritis   . Diabetes mellitus     diet control  . Hypertension   . Sleep apnea     Assessment/Plan:   2 Days Post-Op Procedure(s) (LRB): TOTAL HIP ARTHROPLASTY ANTERIOR APPROACH (Right) Active Problems:   Primary osteoarthritis of right hip   Acute post op blood loss anemia    Estimated body mass index is 43.28 kg/(m^2) as calculated from  the following:   Height as of 02/01/16: 6\' 1"  (1.854 m).   Weight as of 04/21/12: 148.78 kg (328 lb). Advance diet Up with therapy  Needs BM Continue with Fe supplements CM to assist with discharge to home or Rehab depending on therapy progress  DVT Prophylaxis - Lovenox, Foot Pumps and TED hose Weight-Bearing as tolerated to right leg D/C O2 and Pulse OX and try on Room Air  T. Cranston Neighborhris Sritha Chauncey, PA-C [facility] 02/16/2016, 8:10 AM

## 2016-02-16 NOTE — Progress Notes (Signed)
Physical Therapy Treatment Patient Details Name: Jerome Cunningham MRN: [id number] DOB: 1955-11-25 Today's Date: 02/16/2016    History of Present Illness Pt is a 60 y.o. male who underwent R THR anterior approach without reported post-op complications.     PT Comments    Pt eager to participate with PT. Continues with significant pain in R hip and sensation into thigh with movement/ambulation. Burning/tingling sensation in R anterior hip/thigh may be adding to difficulty with hip flexion in stand for improved clearance of Right lower extremity with ambulation. Pt required less assist for bed mobility, but effortful and time intensive. Pt improves stand; however, requires increased instruction for safe sitting and avoiding increased pain. Improved with second second trial. Ambulation also very time intensive and requires increased cueing for technique and sequence as well as rolling walker management. Improves inconsistently throughout session. Pt receives up in chair and participates in exercises, which pt was encouraged to perform throughout the day. Plan to see pt this afternoon and attempt stair climbing. It is noted pt has 3 low rise steps to enter at home with left rail ascending.   Follow Up Recommendations  SNF (versus HHPT depending on progress)     Equipment Recommendations  Rolling walker with 5" wheels (bariatric)    Recommendations for Other Services       Precautions / Restrictions Precautions Precautions: Anterior Hip Precaution Booklet Issued: Yes (comment) Restrictions Weight Bearing Restrictions: Yes RLE Weight Bearing: Weight bearing as tolerated    Mobility  Bed Mobility Overal bed mobility: Modified Independent (Significant increased time/effort/rails; ultimately performs)       Supine to sit: Modified independent (Device/Increase time)     General bed mobility comments: Heavy use of bed rail and increased time to maneuver LEs off L side of bed (setup at  home) Pt notes he has bed that adjusts head of bed at home.  Transfers Overall transfer level: Needs assistance Equipment used: Rolling walker (2 wheeled) Transfers: Sit to/from Stand Sit to Stand: Min assist         General transfer comment: Requires increased cueing for sitting with improved comfort and controlled descent; much better with second sit. Able to stand without elevating bed   Ambulation/Gait Ambulation/Gait assistance: Min guard Ambulation Distance (Feet): 54 Feet (Second walk 20 ft) Assistive device: Rolling walker (2 wheeled) Gait Pattern/deviations: Step-through pattern;Decreased step length - left;Decreased stance time - right;Decreased dorsiflexion - right;Decreased weight shift to right;Antalgic;Wide base of support (Decreased hip/knee flexion on R) Gait velocity: Significantly decreased Gait velocity interpretation: <1.8 ft/sec, indicative of risk for recurrent falls (Many pauses, standing to bend R knee) General Gait Details: Rw lowered for more appropriate fit and improved ability to weightbear through BUEs, which did improve pt's stand time on RLE and softer landing on L foot. Pt continues to have difficulty maneuvering RLE and LLE fatigues although no LOB or unsteadiness noted throughout.    Stairs            Wheelchair Mobility    Modified Rankin (Stroke Patients Only)       Balance Overall balance assessment: Needs assistance Sitting-balance support: Feet supported Sitting balance-Leahy Scale: Good     Standing balance support: Bilateral upper extremity supported Standing balance-Leahy Scale: Fair                      Cognition Arousal/Alertness: Awake/alert Behavior During Therapy: WFL for tasks assessed/performed Overall Cognitive Status: Within Functional Limits for tasks assessed  Exercises Total Joint Exercises Ankle Circles/Pumps: AROM;Both;20 reps (long sit) Quad Sets: Strengthening;Both;20  reps (long sit) Gluteal Sets: Strengthening;Both;20 reps (long sit) Towel Squeeze: Strengthening;Both;20 reps (long sit) Heel Slides: AAROM;Right;10 reps (long sit) Marching in Standing: Strengthening;Right;10 reps;Standing (2 sets)    General Comments        Pertinent Vitals/Pain Pain Assessment: 0-10 Pain Score: 8  Pain Location: L hip  Pain Descriptors / Indicators: Pins and needles;Burning (in RLE) Pain Intervention(s): Repositioned;Premedicated before session;Monitored during session    Home Living                      Prior Function            PT Goals (current goals can now be found in the care plan section) Progress towards PT goals: Progressing toward goals    Frequency  BID    PT Plan Current plan remains appropriate    Co-evaluation             End of Session Equipment Utilized During Treatment: Gait belt Activity Tolerance: Patient limited by pain;Patient tolerated treatment well Patient left: in chair;with call bell/phone within reach;with chair alarm set;with SCD's reapplied     Time: 1127-1207 PT Time Calculation (min) (ACUTE ONLY): 40 min  Charges:  $Gait Training: 8-22 mins $Therapeutic Exercise: 8-22 mins $Therapeutic Activity: 8-22 mins                    G Codes:      Kristeen MissHeidi Elizabeth Bishop, PTA 02/16/2016, 1:42 PM

## 2016-02-16 NOTE — Progress Notes (Signed)
Physical Therapy Treatment Patient Details Name: Jerome MinaMichael S Cunningham MRN: [id number] DOB: 03/31/1956 Today's Date: 02/16/2016    History of Present Illness Pt is a 60 y.o. male who underwent R THR anterior approach without reported post-op complications.     PT Comments    Pt continues up in chair from morning session and doing well. Pain 7/10 R hip/thigh; requested pain medications and nursing provided during session prior to steps. Pt notes he performed exercises prior to afternoon session for lower extremities in long sit and seated positions. Pt continues with quality and speed issues with ambulation, but participates well with excellent motivation. Pt notes ambulating with deviations for quite a while prior to surgery; expect pt with require consistent effort and time to ultimately correct. Pt does manage up/down 4 steps with corrections to technique and instruction for sequence as needed; also demonstrated side step option using both upper extremities on 1 rail (pt has 2 steps to enter option at home). Pt returned to room via w/c and anticipated returning to bed. OT arrives; therefore, pt returned to edge of bed and left in OT's care. Plan to see pt tomorrow to continue strength progression as well as endurance and quality/speed of ambulation for improved functional mobility. Pt progressing and feel pt will be able to return home safely with supervision/assist as needed as pain decreases and pins/needles and burning sensation in Right lower extremity improve.   Follow Up Recommendations  Home health PT     Equipment Recommendations  Rolling walker with 5" wheels (bariatric)    Recommendations for Other Services       Precautions / Restrictions Precautions Precautions: Anterior Hip Precaution Booklet Issued: Yes (comment) Restrictions Weight Bearing Restrictions: Yes RLE Weight Bearing: Weight bearing as tolerated    Mobility  Bed Mobility Overal bed mobility:  (Significant  increased time/effort/rails; ultimately performs)       Supine to sit: Modified independent (Device/Increase time)     General bed mobility comments: Not tested; returned to edge of bed for OT to work with  Transfers Overall transfer level: Needs assistance Equipment used: Rolling walker (2 wheeled) Transfers: Sit to/from Stand (from recliner and w/c 2x) Sit to Stand: Min assist         General transfer comment: Improving especially to stand; mild cues given for placement of RLE with sit  Ambulation/Gait Ambulation/Gait assistance: Min guard Ambulation Distance (Feet): 20 Feet Assistive device: Rolling walker (2 wheeled) Gait Pattern/deviations: Step-through pattern (mild vaulting on L) Gait velocity: Significantly decreased Gait velocity interpretation: <1.8 ft/sec, indicative of risk for recurrent falls General Gait Details: Corrections given for avoidance of vaulting; improves with correction inconsistently. Pt notes he has ambulated like this prior to surgery for a long time; will take a conscious effort and improved function from RLE to correct   Stairs Stairs: Yes Stairs assistance: Min guard Stair Management: Two rails;Forwards;Step to pattern Number of Stairs: 4 General stair comments: Initially poor posture, altering center of gravity makeing task difficult; but corrected with instruction. Pt also instructed verbally with demonstration on use of 1 handrail on R ascending using BUEs and sideways step to pattern. Pt has both options at home.   Wheelchair Mobility    Modified Rankin (Stroke Patients Only)       Balance Overall balance assessment: Needs assistance Sitting-balance support: Feet supported Sitting balance-Leahy Scale: Normal     Standing balance support: Bilateral upper extremity supported Standing balance-Leahy Scale: Fair  Cognition Arousal/Alertness: Awake/alert Behavior During Therapy: WFL for tasks  assessed/performed Overall Cognitive Status: Within Functional Limits for tasks assessed                      Exercises Total Joint Exercises Ankle Circles/Pumps: AROM;Both;20 reps (long sit) Quad Sets: Strengthening;Both;20 reps (long sit) Gluteal Sets: Strengthening;Both;20 reps (long sit) Towel Squeeze: Strengthening;Both;20 reps (long sit) Heel Slides: AAROM;Right;10 reps (long sit) Marching in Standing: Strengthening;Right;10 reps;Standing (2 sets)    General Comments        Pertinent Vitals/Pain Pain Assessment: 0-10 Pain Score: 7  Pain Location: L hip Pain Descriptors / Indicators: Burning;Pins and needles Pain Intervention(s): Limited activity within patient's tolerance;Repositioned;RN gave pain meds during session    Home Living                      Prior Function            PT Goals (current goals can now be found in the care plan section) Progress towards PT goals: Progressing toward goals    Frequency  BID    PT Plan Discharge plan needs to be updated    Co-evaluation             End of Session Equipment Utilized During Treatment: Gait belt Activity Tolerance: Patient limited by pain;Patient tolerated treatment well Patient left: Other (comment) (edge of bed with OT)     Time: 0865-7846 PT Time Calculation (min) (ACUTE ONLY): 29 min  Charges:  $Gait Training: 23-37 mins $Therapeutic Exercise: 8-22 mins $Therapeutic Activity: 8-22 mins                    G CodesKristeen Miss, PTA 02/16/2016, 4:42 PM

## 2016-02-17 MED ORDER — BISACODYL 10 MG RE SUPP
10.0000 mg | Freq: Once | RECTAL | Status: AC
  Administered 2016-02-17: 10 mg via RECTAL
  Filled 2016-02-17: qty 1

## 2016-02-17 MED ORDER — OXYCODONE HCL 5 MG PO TABS: 5.0000 mg | ORAL_TABLET | ORAL | Status: DC | PRN

## 2016-02-17 MED ORDER — ENOXAPARIN SODIUM 40 MG/0.4ML ~~LOC~~ SOLN: 40.0000 mg | SUBCUTANEOUS | Status: DC

## 2016-02-17 MED ORDER — LACTULOSE 10 GM/15ML PO SOLN: 20.0000 g | Freq: Every day | ORAL | Status: DC | PRN

## 2016-02-17 NOTE — Progress Notes (Signed)
Occupational Therapy Treatment Patient Details Name: Jerome Cunningham MRN: [id number] DOB: 06/16/1956 Today's Date: 02/17/2016    History of present illness Pt is a 60 y.o. male who underwent R THR anterior approach without reported post-op complications.    OT comments  Pt seen for education in use of other theraband options with attached foot loop for helping to get RLE up and into bed during transfer into bed.  Also reviewed rec AD for ADLs and no rec OT HH to continued training in ADLs and education for his wife to prevent injuries and promote independence in ADLs at home in order to [facility].  Follow Up Recommendations  Home health OT    Equipment Recommendations       Recommendations for Other Services      Precautions / Restrictions Precautions Precautions: Anterior Hip Restrictions Weight Bearing Restrictions: Yes RLE Weight Bearing: Weight bearing as tolerated       Mobility Bed Mobility                  Transfers                      Balance                                   ADL                                         General ADL Comments: Pt educated in options for using theraband to help with getting legs up and into bed.  Reviewed rec AD since pt is going home today and rec OT HH.      Vision                     Perception     Praxis      Cognition                             Extremity/Trunk Assessment               Exercises     Shoulder Instructions       General Comments      Pertinent Vitals/ Pain          Home Living                                          Prior Functioning/Environment              Frequency Min 1X/week     Progress Toward Goals  OT Goals(current goals can now be found in the care plan section)  Progress towards OT goals: Progressing toward goals  Acute Rehab OT Goals Patient Stated Goal: Improve  function at home OT Goal Formulation: With patient Time For Goal Achievement: 02/22/16 Potential to Achieve Goals: Good  Plan Discharge plan remains appropriate    Co-evaluation                 End of Session     Activity Tolerance Patient tolerated treatment well   Patient Left in bed;with call bell/phone within reach;with bed alarm set   Nurse  Communication          Time: 1610-9604 OT Time Calculation (min): 15 min  Charges: OT General Charges $OT Visit: 1 Procedure OT Treatments $Self Care/Home Management : 8-22 mins   Susanne Borders, OTR/L ascom [phone] 02/17/2016, 10:17 AM

## 2016-02-17 NOTE — Clinical Social Work Note (Signed)
Pt will discharge home with home health services. PT is now recommending HHPT. RNCM followed for discharge planning needs. CSW will signing off as no further needs identified.   Dede QuerySarah Coral Timme, MSW, LCSW  Clinical Social Worker  [phone]

## 2016-02-17 NOTE — Care Management (Signed)
Bariatric walker pending delivery today by [facility]. [facility] today for HHPT. No further RNCM needs after walker is delivered to this room. Patient aware.

## 2016-02-17 NOTE — Discharge Instructions (Signed)

## 2016-02-17 NOTE — Discharge Summary (Signed)
Physician Discharge Summary  Patient ID: Jerome Cunningham MRN: [id number] DOB/AGE: 1956/02/22 60 y.o.  Admit date: 02/14/2016 Discharge date: 02/17/2016  Admission Diagnoses:  osteoarthritis   Discharge Diagnoses: Patient Active Problem List   Diagnosis Date Noted  . Primary osteoarthritis of right hip 02/14/2016    Past Medical History  Diagnosis Date  . Arthritis   . Diabetes mellitus     diet control  . Hypertension   . Sleep apnea      Transfusion: none   Consultants (if any):    Discharged Condition: Improved  Hospital Course: Jerome Cunningham is an 60 y.o. male who was admitted 02/14/2016 with a diagnosis of right hip osteoarthritis and went to the operating room on 02/14/2016 and underwent the above named procedures.    Surgeries: Procedure(s): TOTAL HIP ARTHROPLASTY ANTERIOR APPROACH on 02/14/2016 Patient tolerated the surgery well. Taken to PACU where she was stabilized and then transferred to the orthopedic floor.  Started on Lovenox 40 q 12 hrs. Foot pumps applied bilaterally at 80 mm. Heels elevated on bed with rolled towels. No evidence of DVT. Negative Homan. Physical therapy started on day #1 for gait training and transfer. OT started day #1 for ADL and assisted devices.  Patient's foley was d/c on day #1. Patient's IV and hemovac was d/c on day #2.  On post op day #3 patient was stable and ready for discharge to home with HHPT.  Implants: Medacta AMIS 3 lateralized stem with M 28 mm head,Mpact cup DM 58 mm with liner  He was given perioperative antibiotics:  Anti-infectives    Start     Dose/Rate Route Frequency Ordered Stop   02/14/16 1400  ceFAZolin (ANCEF) 3 g in dextrose 5 % 50 mL IVPB     3 g 130 mL/hr over 30 Minutes Intravenous Every 6 hours 02/14/16 1110 02/15/16 0227   02/14/16 0613  ceFAZolin (ANCEF) 2-4 GM/100ML-% IVPB    Comments:  Eli Hose: cabinet override      02/14/16 0613 02/14/16 1814   02/14/16 0030  ceFAZolin (ANCEF)  IVPB 2g/100 mL premix     2 g 200 mL/hr over 30 Minutes Intravenous  Once 02/14/16 0015 02/14/16 0735    .  He was given sequential compression devices, early ambulation, and lovenox for DVT prophylaxis.  He benefited maximally from the hospital stay and there were no complications.    Recent vital signs:  Filed Vitals:   02/16/16 1941 02/17/16 0501  BP: 134/63 130/62  Pulse: 99 95  Temp: 99.4 F (37.4 C) 98.7 F (37.1 C)  Resp: 19 19    Recent laboratory studies:  Lab Results  Component Value Date   HGB 9.8* 02/16/2016   HGB 10.6* 02/15/2016   HGB 12.1* 02/14/2016   Lab Results  Component Value Date   WBC 10.5 02/15/2016   PLT 215 02/15/2016   Lab Results  Component Value Date   INR 0.96 02/01/2016   Lab Results  Component Value Date   NA 136 02/15/2016   K 3.7 02/15/2016   CL 107 02/15/2016   CO2 25 02/15/2016   BUN 18 02/15/2016   CREATININE 0.83 02/15/2016   GLUCOSE 118* 02/15/2016    Discharge Medications:     Medication List    TAKE these medications        acetaminophen 500 MG tablet  Commonly known as:  TYLENOL  Take 500 mg by mouth every 6 (six) hours as needed.     aspirin-acetaminophen-caffeine [phone]  MG tablet  Commonly known as:  EXCEDRIN MIGRAINE  Take 2 tablets by mouth every 6 (six) hours as needed. For headache.     enoxaparin 40 MG/0.4ML injection  Commonly known as:  LOVENOX  Inject 0.4 mLs (40 mg total) into the skin daily.     ibuprofen 600 MG tablet  Commonly known as:  ADVIL,MOTRIN  Take 600 mg by mouth every 8 (eight) hours as needed.     losartan 100 MG tablet  Commonly known as:  COZAAR  Take 100 mg by mouth at bedtime.     oxyCODONE 5 MG immediate release tablet  Commonly known as:  Oxy IR/ROXICODONE  Take 1-2 tablets (5-10 mg total) by mouth every 3 (three) hours as needed for breakthrough pain.        Diagnostic Studies: Dg Hip Operative Unilat W Or W/o Pelvis Right  02/14/2016  CLINICAL DATA:  For  right hip replaced EXAM: OPERATIVE right HIP (WITH PELVIS IF PERFORMED) 5 VIEWS TECHNIQUE: Fluoroscopic spot image(s) were submitted for interpretation post-operatively. COMPARISON:  None. FINDINGS: Five C-arm spot films were returned. The initial spot film show significant degenerative joint disease of the right hip. Additional images show right hemi arthroplasty in good position on the images obtained. No complicating features are seen. IMPRESSION: Right hemiarthroplasty.  No complicating features. Electronically Signed   By: Dwyane DeePaul  Barry M.D.   On: 02/14/2016 09:50   Dg Hip Unilat W Or W/o Pelvis 2-3 Views Right  02/14/2016  CLINICAL DATA:  Post total hip replacement. EXAM: DG HIP (WITH OR WITHOUT PELVIS) 2-3V RIGHT COMPARISON:  None. FINDINGS: Changes of right total hip replacement. Normal alignment. No hardware or bony complicating feature noted. IMPRESSION: Right hip replacement without visible complicating feature. Electronically Signed   By: Charlett NoseKevin  Dover M.D.   On: 02/14/2016 11:11    Disposition: 01-Home or Self Care        Follow-up Information    Follow up with [facility] .   Specialty:  Skilled Nursing Facility   Contact information:   [address] [place] [id number] [phone]      Follow up with MENZ,Cornelius, MD In 2 weeks.   Specialty:  Orthopedic Surgery   Why:  For staple removal and skin check   Contact information:   [address] [facility] [place] [address] [phone]        Signed: Patience MuscaGAINES, THOMAS CHRISTOPHER 02/17/2016, 7:37 AM

## 2016-02-17 NOTE — Progress Notes (Signed)
   Subjective: 3 Days Post-Op Procedure(s) (LRB): TOTAL HIP ARTHROPLASTY ANTERIOR APPROACH (Right) Patient reports pain as moderate.   Patient is well, and has had no acute complaints or problems Denies any CP, SOB, ABD pain. We will continue therapy today.  Plan is to go Home after hospital stay.  Objective: Vital signs in last 24 hours: Temp:  [97.5 F (36.4 C)-100 F (37.8 C)] 98.7 F (37.1 C) (05/26 0501) Pulse Rate:  [84-99] 95 (05/26 0501) Resp:  [16-19] 19 (05/26 0501) BP: (130-147)/(62-73) 130/62 mmHg (05/26 0501) SpO2:  [94 %-97 %] 96 % (05/26 0501)  Intake/Output from previous day: 05/25 0701 - 05/26 0700 In: 3110 [P.O.:480; I.V.:2630] Out: 3025 [Urine:3025] Intake/Output this shift:     Recent Labs  02/14/16 1217 02/15/16 0423 02/16/16 0409  HGB 12.1* 10.6* 9.8*    Recent Labs  02/14/16 1217 02/15/16 0423  WBC 15.7* 10.5  RBC 4.26* 3.57*  HCT 37.4* 31.4*  PLT 265 215    Recent Labs  02/14/16 1217 02/15/16 0423  NA  --  136  K  --  3.7  CL  --  107  CO2  --  25  BUN  --  18  CREATININE 0.77 0.83  GLUCOSE  --  118*  CALCIUM  --  8.2*   No results for input(s): LABPT, INR in the last 72 hours.  EXAM General - Patient is Alert, Appropriate and Oriented Extremity - Neurovascular intact Sensation intact distally Intact pulses distally Dorsiflexion/Plantar flexion intact No cellulitis present Dressing - dressing C/D/I and no drainage,  wound vac intact. No drainage in canister. Motor Function - intact, moving foot and toes well on exam.   Past Medical History  Diagnosis Date  . Arthritis   . Diabetes mellitus     diet control  . Hypertension   . Sleep apnea     Assessment/Plan:   3 Days Post-Op Procedure(s) (LRB): TOTAL HIP ARTHROPLASTY ANTERIOR APPROACH (Right) Active Problems:   Primary osteoarthritis of right hip   Acute post op blood loss anemia    Estimated body mass index is 43.28 kg/(m^2) as calculated from the  following:   Height as of 02/01/16: 6\' 1"  (1.854 m).   Weight as of 04/21/12: 148.78 kg (328 lb). Advance diet Up with therapy  Plan on discharge to home with HHPT today pending good progress with PT and BM.  DVT Prophylaxis - Lovenox, Foot Pumps and TED hose Weight-Bearing as tolerated to right leg D/C O2 and Pulse OX and try on Room Air  T. Cranston Neighborhris Lakeshia Dohner, PA-C [facility] 02/17/2016, 7:33 AM

## 2016-02-17 NOTE — Progress Notes (Signed)
Physical Therapy Treatment Patient Details Name: Jerome Cunningham MRN: [id number] DOB: 1956/09/23 Today's Date: 02/17/2016    History of Present Illness Pt is a 60 y.o. male who underwent R THR anterior approach without reported post-op complications.     PT Comments    Pt is making significant progress towards goals with increased ambulation this date. Ambulation still slow and needs rest breaks. Educated to ambulate short distances at a time for energy conservation. Pt now has BRW and adjusted to height. Good endurance with there-ex. Pt limited secondary to pain. Pt reports he felt confident with stairs and able to dictate sequencing per instructions yesterday. Pt has written HEP, reviewed. Pt is ready for dc to home. Eager to begin HHPT.  Follow Up Recommendations  Home health PT     Equipment Recommendations  Rolling walker with 5" wheels    Recommendations for Other Services       Precautions / Restrictions Precautions Precautions: Anterior Hip Precaution Booklet Issued: Yes (comment) Restrictions Weight Bearing Restrictions: Yes RLE Weight Bearing: Weight bearing as tolerated    Mobility  Bed Mobility Overal bed mobility: Needs Assistance Bed Mobility: Supine to Sit;Sit to Supine     Supine to sit: Min assist     General bed mobility comments: min assist required for lifting R leg on/off bed. Pt performs transfer with slow technique, however very safe. Pt demonstrated both supine<>sit. Problem solved with pt different techniques for entering bed on different sides and using leg lift from towel. Pt feels confident with this task  Transfers Overall transfer level: Needs assistance Equipment used: Rolling walker (2 wheeled) Transfers: Sit to/from Stand Sit to Stand: Supervision         General transfer comment: Cues given for safe technique. BRW used and bed elevated.  Once standing, pt able to stand without LOB  Ambulation/Gait Ambulation/Gait assistance:  Min guard Ambulation Distance (Feet): 100 Feet Assistive device: Rolling walker (2 wheeled) Gait Pattern/deviations: Step-through pattern     General Gait Details: Slow gait speed with initial step to gait pattern progressing to step through. Pt still vaulting on L LE. 2 standing rest breaks noted. No buckling noted on either leg. Pt reports pain increased with movement.   Stairs            Wheelchair Mobility    Modified Rankin (Stroke Patients Only)       Balance                                    Cognition Arousal/Alertness: Awake/alert Behavior During Therapy: WFL for tasks assessed/performed Overall Cognitive Status: Within Functional Limits for tasks assessed                      Exercises Other Exercises Other Exercises: supine ther-ex performed including R ankle pumps, quad sets, glut squeezes, hip abd/add, and SAQ. all ther-ex required min/mod assist x 20 reps with cues for sequencing. Hip abd/add only performed x 10 reps secondary to pain. Reviewed written HEP and car transfers reviewed.    General Comments        Pertinent Vitals/Pain Pain Assessment: 0-10 Pain Score: 8  Pain Location: R hip Pain Descriptors / Indicators: Aching;Dull;Discomfort;Operative site guarding Pain Intervention(s): Limited activity within patient's tolerance    Home Living  Prior Function            PT Goals (current goals can now be found in the care plan section) Acute Rehab PT Goals Patient Stated Goal: Improve function at home PT Goal Formulation: With patient Time For Goal Achievement: 02/28/16 Potential to Achieve Goals: Good Progress towards PT goals: Progressing toward goals    Frequency  BID    PT Plan Current plan remains appropriate    Co-evaluation             End of Session Equipment Utilized During Treatment: Gait belt Activity Tolerance: Patient limited by pain;Patient tolerated treatment  well Patient left: in chair;with chair alarm set     Time: 0921-1000 PT Time Calculation (min) (ACUTE ONLY): 39 min  Charges:  $Gait Training: 23-37 mins $Therapeutic Exercise: 8-22 mins                    G Codes:      Leslie Langille 02/17/2016, 11:59 AM Elizabeth PalauStephanie Audiel Scheiber, PT, DPT [phone]

## 2016-02-17 NOTE — Progress Notes (Signed)
DISCHARGE NOTE:  Pt given discharge instructions and prescriptions. Pt verbalized understanding. Pt wheeled to car by staff.  

## 2016-02-17 NOTE — Care Management (Signed)
Inpatient OT is recommending HHOT. Order received from Dr. Rosita KeaMenz via telephone. [facility]. Bariatric walker delivered [facility]. No further RNCM needs. Case closed.

## 2016-04-16 LAB — SURGICAL PATHOLOGY

## 2017-04-19 DIAGNOSIS — Z862 Personal history of diseases of the blood and blood-forming organs and certain disorders involving the immune mechanism: Secondary | ICD-10-CM | POA: Insufficient documentation

## 2017-11-19 ENCOUNTER — Other Ambulatory Visit: Payer: Self-pay | Admitting: Family Medicine

## 2017-11-19 DIAGNOSIS — N5089 Other specified disorders of the male genital organs: Secondary | ICD-10-CM

## 2017-11-21 ENCOUNTER — Ambulatory Visit
Admission: RE | Admit: 2017-11-21 | Discharge: 2017-11-21 | Disposition: A | Discharge status: Home / Self Care | Source: Ambulatory Visit | Attending: Family Medicine | Admitting: Family Medicine

## 2017-11-21 DIAGNOSIS — N5089 Other specified disorders of the male genital organs: Secondary | ICD-10-CM | POA: Insufficient documentation

## 2017-11-21 DIAGNOSIS — N503 Cyst of epididymis: Secondary | ICD-10-CM | POA: Diagnosis not present

## 2017-11-21 DIAGNOSIS — N433 Hydrocele, unspecified: Secondary | ICD-10-CM | POA: Insufficient documentation

## 2017-11-26 ENCOUNTER — Encounter: Payer: Self-pay | Admitting: Urology

## 2017-11-26 ENCOUNTER — Ambulatory Visit (INDEPENDENT_AMBULATORY_CARE_PROVIDER_SITE_OTHER): Admitting: Urology

## 2017-11-26 VITALS — BP 161/84 | HR 94 | Ht 73.0 in | Wt 368.9 lb

## 2017-11-26 DIAGNOSIS — N433 Hydrocele, unspecified: Secondary | ICD-10-CM

## 2017-11-26 LAB — URINALYSIS, COMPLETE
BILIRUBIN UA: NEGATIVE
GLUCOSE, UA: NEGATIVE
KETONES UA: NEGATIVE
NITRITE UA: NEGATIVE
Protein, UA: NEGATIVE
RBC, UA: NEGATIVE
SPEC GRAV UA: 1.025 (ref 1.005–1.030)
UUROB: 0.2 mg/dL (ref 0.2–1.0)
pH, UA: 5.5 (ref 5.0–7.5)

## 2017-11-26 LAB — MICROSCOPIC EXAMINATION
Bacteria, UA: NONE SEEN
Epithelial Cells (non renal): NONE SEEN /hpf (ref 0–10)
RBC MICROSCOPIC, UA: NONE SEEN /HPF (ref 0–?)

## 2017-11-26 NOTE — Progress Notes (Signed)
11/26/2017 9:56 AM   Jerome Cunningham 06/19/1956 [id number]  Referring provider: Marisue IvanLinthavong, Kanhka, MD [phone] [facility] [place], [place] [address]  Chief Complaint  Patient presents with  . Hydrocele    HPI: Jerome Cunningham seen in consultation at the request of Dr. Burnadette PopLinthavong for evaluation of a left hydrocele.  He presents with a several month history of a left hemiscrotal enlargement.  He has no pain but states he has some annoyance due to the size as it causes retraction of his penis and makes it more difficult to void.  A scrotal ultrasound was performed on 11/21/2017 which showed bilateral hydroceles left greater than right.  He denies previous history of urologic problems.  He has nocturia x1-2 and mild urinary frequency.  His voiding symptoms are not bothersome.  Denies dysuria or gross hematuria.  Denies flank/abdominal/pelvic pain.   PMH: Past Medical History:  Diagnosis Date  . Arthritis   . Diabetes mellitus    diet control  . Hypertension   . Sleep apnea     Surgical History: Past Surgical History:  Procedure Laterality Date  . HIP SURGERY    . NASAL SEPTUM SURGERY    . TONSILLECTOMY    . TOTAL HIP ARTHROPLASTY Right 02/14/2016   Procedure: TOTAL HIP ARTHROPLASTY ANTERIOR APPROACH;  Surgeon: Kennedy BuckerMichael Menz, MD;  Location: [facility] ORS;  Service: Orthopedics;  Laterality: Right;    Home Medications:  Allergies as of 11/26/2017      Reactions   Tramadol Other (See Comments)   "increases pain"      Medication List        Accurate as of 11/26/17  9:56 AM. Always use your most recent med list.          acetaminophen 500 MG tablet Commonly known as:  TYLENOL Take 500 mg by mouth every 6 (six) hours as needed.   aspirin EC 81 MG tablet Take by mouth.   aspirin-acetaminophen-caffeine 250-250-65 MG tablet Commonly known as:  EXCEDRIN MIGRAINE Take 2 tablets by mouth every 6 (six) hours as needed. For headache.   cetirizine 10 MG tablet Commonly known as:  ZYRTEC Take by mouth.   enoxaparin 40 MG/0.4ML injection Commonly known as:  LOVENOX Inject 0.4 mLs (40 mg total) into the skin daily.   fluticasone 50 MCG/ACT nasal spray Commonly known as:  FLONASE Place into the nose.   ibuprofen 600 MG tablet Commonly known as:  ADVIL,MOTRIN Take 600 mg by mouth every 8 (eight) hours as needed.   losartan 100 MG tablet Commonly known as:  COZAAR Take 100 mg by mouth at bedtime.   metFORMIN 500 MG tablet Commonly known as:  GLUCOPHAGE   ONETOUCH DELICA LANCETS FINE Misc Use 1 each as directed. Check CBG's fasting once daily. Dx: E11.9       Allergies:  Allergies  Allergen Reactions  . Tramadol Other (See Comments)    "increases pain"    Family History: Family History  Problem Relation Age of Onset  . Kidney disease Paternal Grandfather   . Prostate cancer Neg Hx   . Bladder Cancer Neg Hx   . Kidney cancer Neg Hx     Social History:  reports that he has been smoking.  He has been smoking about 1.00 pack per day. he has never used smokeless tobacco. He reports that he does not drink alcohol or use drugs.  ROS: UROLOGY Frequent Urination?: Yes Hard to postpone urination?: No Burning/pain with  urination?: No Get up at night to urinate?: Yes Leakage of urine?: No Urine stream starts and stops?: No Trouble starting stream?: No Do you have to strain to urinate?: No Blood in urine?: No Urinary tract infection?: No Sexually transmitted disease?: No Injury to kidneys or bladder?: No Painful intercourse?: No Weak stream?: No Erection problems?: No Penile pain?: No  Gastrointestinal Nausea?: No Vomiting?: No Indigestion/heartburn?: No Diarrhea?: No Constipation?: No  Constitutional Fever: No Night sweats?: Yes Weight loss?: No Fatigue?: No  Skin Skin rash/lesions?: No Itching?: No  Eyes Blurred vision?: No Double vision?: No  Ears/Nose/Throat Sore throat?:  No Sinus problems?: No  Hematologic/Lymphatic Swollen glands?: No Easy bruising?: No  Cardiovascular Leg swelling?: No Chest pain?: No  Respiratory Cough?: No Shortness of breath?: No  Endocrine Excessive thirst?: No  Musculoskeletal Back pain?: No Joint pain?: No  Neurological Headaches?: No Dizziness?: No  Psychologic Depression?: No Anxiety?: No  Physical Exam: BP (!) 161/84 (BP Location: Right Arm, Patient Position: Sitting, Cuff Size: Large)   Pulse 94   Ht 6\' 1"  (1.854 m)   Wt (!) 368 lb 14.4 oz (167.3 kg)   BMI 48.67 kg/m   Constitutional:  Alert and oriented, No acute distress. HEENT: [place] AT, moist mucus membranes.  Trachea midline, no masses. Cardiovascular: No clubbing, cyanosis, or edema. Respiratory: Normal respiratory effort, no increased work of breathing. GI: Abdomen is soft, nontender, nondistended, no abdominal masses GU: No CVA tenderness.  Moderate to large left hydrocele with penile retraction.  Right testis palpably normal and no significant hydrocele present. Skin: No rashes, bruises or suspicious lesions. Lymph: No cervical or inguinal adenopathy. Neurologic: Grossly intact, no focal deficits, moving all 4 extremities. Psychiatric: Normal mood and affect.  Laboratory Data: Lab Results  Component Value Date   WBC 10.5 02/15/2016   HGB 9.8 (L) 02/16/2016   HCT 31.4 (L) 02/15/2016   MCV 88.1 02/15/2016   PLT 215 02/15/2016    Lab Results  Component Value Date   CREATININE 0.83 02/15/2016    Urinalysis Dipstick-negative Microscopy-negative  Pertinent Imaging: Scrotal ultrasound was personally reviewed.  Moderate left hydrocele.    Assessment & Plan:   1. Hydrocele, unspecified hydrocele type Mildly symptomatic left hydrocele.  We discussed the decision to proceed with treatment would be based on symptom bother.  I discussed options of observation and hydrocelectomy.  Aspiration/sclerotherapy was discussed but not recommended.   At this point he has elected observation and will return for worsening symptoms.  - Urinalysis, Complete    Riki Altes, MD  [facility] [address] [place], [place] [address] [phone]

## 2017-12-01 ENCOUNTER — Emergency Department
Admission: EM | Admit: 2017-12-01 | Discharge: 2017-12-01 | Disposition: A | Discharge status: Home / Self Care | Attending: Emergency Medicine | Admitting: Emergency Medicine

## 2017-12-01 ENCOUNTER — Other Ambulatory Visit: Payer: Self-pay

## 2017-12-01 ENCOUNTER — Emergency Department

## 2017-12-01 DIAGNOSIS — K5732 Diverticulitis of large intestine without perforation or abscess without bleeding: Secondary | ICD-10-CM | POA: Diagnosis not present

## 2017-12-01 DIAGNOSIS — R1030 Lower abdominal pain, unspecified: Secondary | ICD-10-CM | POA: Diagnosis present

## 2017-12-01 DIAGNOSIS — F17210 Nicotine dependence, cigarettes, uncomplicated: Secondary | ICD-10-CM | POA: Diagnosis not present

## 2017-12-01 DIAGNOSIS — Z79899 Other long term (current) drug therapy: Secondary | ICD-10-CM | POA: Diagnosis not present

## 2017-12-01 DIAGNOSIS — I10 Essential (primary) hypertension: Secondary | ICD-10-CM | POA: Diagnosis not present

## 2017-12-01 DIAGNOSIS — E119 Type 2 diabetes mellitus without complications: Secondary | ICD-10-CM | POA: Insufficient documentation

## 2017-12-01 DIAGNOSIS — Z96641 Presence of right artificial hip joint: Secondary | ICD-10-CM | POA: Diagnosis not present

## 2017-12-01 DIAGNOSIS — Z7901 Long term (current) use of anticoagulants: Secondary | ICD-10-CM | POA: Insufficient documentation

## 2017-12-01 LAB — CBC
HEMATOCRIT: 41 % (ref 40.0–52.0)
HEMOGLOBIN: 14 g/dL (ref 13.0–18.0)
MCH: 29.2 pg (ref 26.0–34.0)
MCHC: 34.2 g/dL (ref 32.0–36.0)
MCV: 85.4 fL (ref 80.0–100.0)
Platelets: 306 10*3/uL (ref 150–440)
RBC: 4.8 MIL/uL (ref 4.40–5.90)
RDW: 14.2 % (ref 11.5–14.5)
WBC: 15.1 10*3/uL — ABNORMAL HIGH (ref 3.8–10.6)

## 2017-12-01 LAB — COMPREHENSIVE METABOLIC PANEL
ALBUMIN: 4.2 g/dL (ref 3.5–5.0)
ALT: 28 U/L (ref 17–63)
ANION GAP: 9 (ref 5–15)
AST: 26 U/L (ref 15–41)
Alkaline Phosphatase: 63 U/L (ref 38–126)
BILIRUBIN TOTAL: 0.7 mg/dL (ref 0.3–1.2)
BUN: 19 mg/dL (ref 6–20)
CHLORIDE: 102 mmol/L (ref 101–111)
CO2: 24 mmol/L (ref 22–32)
Calcium: 9.4 mg/dL (ref 8.9–10.3)
Creatinine, Ser: 0.91 mg/dL (ref 0.61–1.24)
GFR calc Af Amer: >60 mL/min (ref >60)
GFR calc non Af Amer: >60 mL/min (ref >60)
GLUCOSE: 113 mg/dL — AB (ref 65–99)
POTASSIUM: 3.8 mmol/L (ref 3.5–5.1)
Sodium: 135 mmol/L (ref 135–145)
TOTAL PROTEIN: 8.3 g/dL — AB (ref 6.5–8.1)

## 2017-12-01 LAB — URINALYSIS, COMPLETE (UACMP) WITH MICROSCOPIC
BILIRUBIN URINE: NEGATIVE
Bacteria, UA: NONE SEEN
Glucose, UA: NEGATIVE mg/dL
HGB URINE DIPSTICK: NEGATIVE
Ketones, ur: NEGATIVE mg/dL
Leukocytes, UA: NEGATIVE
NITRITE: NEGATIVE
PROTEIN: NEGATIVE mg/dL
Specific Gravity, Urine: 1.025 (ref 1.005–1.030)
Squamous Epithelial / LPF: NONE SEEN
pH: 5 (ref 5.0–8.0)

## 2017-12-01 LAB — INFLUENZA PANEL BY PCR (TYPE A & B)
INFLAPCR: NEGATIVE
Influenza B By PCR: NEGATIVE

## 2017-12-01 LAB — LIPASE, BLOOD: LIPASE: 24 U/L (ref 11–51)

## 2017-12-01 MED ORDER — HYDROCODONE-ACETAMINOPHEN 5-325 MG PO TABS: 1.0000 | ORAL_TABLET | Freq: Four times a day (QID) | ORAL | 0 refills | Status: DC | PRN

## 2017-12-01 MED ORDER — MORPHINE SULFATE (PF) 4 MG/ML IV SOLN
8.0000 mg | Freq: Once | INTRAVENOUS | Status: AC
  Administered 2017-12-01: 8 mg via INTRAVENOUS
  Filled 2017-12-01: qty 2

## 2017-12-01 MED ORDER — AMOXICILLIN-POT CLAVULANATE 875-125 MG PO TABS: 1.0000 | ORAL_TABLET | Freq: Two times a day (BID) | ORAL | 0 refills | Status: AC

## 2017-12-01 MED ORDER — MORPHINE SULFATE (PF) 4 MG/ML IV SOLN
INTRAVENOUS | Status: AC
  Filled 2017-12-01: qty 1

## 2017-12-01 MED ORDER — IOPAMIDOL (ISOVUE-300) INJECTION 61%
125.0000 mL | Freq: Once | INTRAVENOUS | Status: AC | PRN
  Administered 2017-12-01: 125 mL via INTRAVENOUS
  Filled 2017-12-01: qty 150

## 2017-12-01 MED ORDER — ONDANSETRON HCL 4 MG/2ML IJ SOLN
4.0000 mg | Freq: Once | INTRAMUSCULAR | Status: AC
Start: 2017-12-01 — End: 2017-12-01
  Administered 2017-12-01: 4 mg via INTRAVENOUS
  Filled 2017-12-01: qty 2

## 2017-12-01 MED ORDER — AMOXICILLIN-POT CLAVULANATE 875-125 MG PO TABS
1.0000 | ORAL_TABLET | Freq: Once | ORAL | Status: AC
  Administered 2017-12-01: 1 via ORAL
  Filled 2017-12-01 (×2): qty 1

## 2017-12-01 NOTE — ED Notes (Signed)
Pt reports having groin pain, abdominal pain, fever and constipation since wed

## 2017-12-01 NOTE — ED Provider Notes (Signed)
[facility] Emergency Department Provider Note  ____________________________________________   First MD Initiated Contact with Patient 12/01/17 1826     (approximate)  I have reviewed the triage vital signs and the nursing notes.   HISTORY  Chief Complaint Abdominal Pain   HPI Jerome Cunningham is a 62 y.o. male who self presents to the emergency department with 2-3 days of intermittent moderate to severe cramping throbbing lower abdominal discomfort associated with constipation.  He has had low-grade fever at home.  He also reports swelling in his left groin.  The pain seems to be somewhat worse after eating somewhat improved with rest.  He has a complex past medical history including diabetes, morbid obesity, and left testicular hydrocele.  He has no previous abdominal surgeries.  He is passing flatus and stools.  He is able to eat and drink.  Past Medical History:  Diagnosis Date  . Arthritis   . Diabetes mellitus    diet control  . Hypertension   . Sleep apnea     Patient Active Problem List   Diagnosis Date Noted  . Hydrocele 11/26/2017  . Primary osteoarthritis of right hip 02/14/2016    Past Surgical History:  Procedure Laterality Date  . HIP SURGERY    . NASAL SEPTUM SURGERY    . TONSILLECTOMY    . TOTAL HIP ARTHROPLASTY Right 02/14/2016   Procedure: TOTAL HIP ARTHROPLASTY ANTERIOR APPROACH;  Surgeon: Kennedy Bucker, MD;  Location: [facility] ORS;  Service: Orthopedics;  Laterality: Right;    Prior to Admission medications   Medication Sig Start Date End Date Taking? Authorizing Provider  acetaminophen (TYLENOL) 500 MG tablet Take 500 mg by mouth every 6 (six) hours as needed.    [provider]  amoxicillin-clavulanate (AUGMENTIN) 875-125 MG tablet Take 1 tablet by mouth 2 (two) times daily for 14 days. 12/01/17 12/15/17  Merrily Brittle, MD  aspirin EC 81 MG tablet Take by mouth.    [provider]    aspirin-acetaminophen-caffeine (EXCEDRIN MIGRAINE) [phone] MG per tablet Take 2 tablets by mouth every 6 (six) hours as needed. For headache.    [provider]  cetirizine (ZYRTEC) 10 MG tablet Take by mouth.    [provider]  enoxaparin (LOVENOX) 40 MG/0.4ML injection Inject 0.4 mLs (40 mg total) into the skin daily. 02/17/16   Evon Slack, PA-C  fluticasone (FLONASE) 50 MCG/ACT nasal spray Place into the nose.    [provider]  HYDROcodone-acetaminophen (NORCO) 5-325 MG tablet Take 1 tablet by mouth every 6 (six) hours as needed for up to 15 doses for severe pain. 12/01/17   Merrily Brittle, MD  ibuprofen (ADVIL,MOTRIN) 600 MG tablet Take 600 mg by mouth every 8 (eight) hours as needed.    [provider]  losartan (COZAAR) 100 MG tablet Take 100 mg by mouth at bedtime.    [provider]  metFORMIN (GLUCOPHAGE) 500 MG tablet  09/11/17   [provider]  [facility] DELICA LANCETS FINE MISC Use 1 each as directed. Check CBG's fasting once daily. Dx: E11.9 07/27/16   [provider]    Allergies Tramadol  Family History  Problem Relation Age of Onset  . Kidney disease Paternal Grandfather   . Prostate cancer Neg Hx   . Bladder Cancer Neg Hx   . Kidney cancer Neg Hx     Social History Social History   Tobacco Use  . Smoking status: Current Every Day Smoker    Packs/day: 1.00  .  Smokeless tobacco: Never Used  Substance Use Topics  . Alcohol use: No  . Drug use: No    Review of Systems Constitutional: No fever/chills Eyes: No visual changes. ENT: No sore throat. Cardiovascular: Denies chest pain. Respiratory: Denies shortness of breath. Gastrointestinal: Positive for abdominal pain.  Positive for nausea, no vomiting.  No diarrhea.  Positive for constipation. Genitourinary: Negative for dysuria. Musculoskeletal: Negative for back pain. Skin: Negative for rash. Neurological: Negative for headaches, focal  weakness or numbness.   ____________________________________________   PHYSICAL EXAM:  VITAL SIGNS: ED Triage Vitals [12/01/17 1716]  Enc Vitals Group     BP (!) 154/77     Pulse Rate (!) 109     Resp 18     Temp 99.2 F (37.3 C)     Temp Source Oral     SpO2 95 %     Weight (!) 368 lb (166.9 kg)     Height 6\' 1"  (1.854 m)     Head Circumference      Peak Flow      Pain Score 8     Pain Loc      Pain Edu?      Excl. in GC?     Constitutional: Alert and oriented x4 appears somewhat uncomfortable nontoxic no diaphoresis speaks in full clear sentences Eyes: PERRL EOMI. Head: Atraumatic. Nose: No congestion/rhinnorhea. Mouth/Throat: No trismus Neck: No stridor.   Cardiovascular: Normal rate, regular rhythm. Grossly normal heart sounds.  Good peripheral circulation. Respiratory: Normal respiratory effort.  No retractions. Lungs CTAB and moving good air Gastrointestinal: Morbidly obese abdomen tender in the lower quadrants although with no rebound or guarding no peritonitis he does have a large left-sided hydrocele with no tenderness and normal testes normal lie Musculoskeletal: No lower extremity edema   Neurologic:  Normal speech and language. No gross focal neurologic deficits are appreciated. Skin:  Skin is warm, dry and intact. No rash noted. Psychiatric: Mood and affect are normal. Speech and behavior are normal.    ____________________________________________   DIFFERENTIAL includes but not limited to  Appendicitis, diverticulitis, volvulus, small bowel obstruction, necrotizing soft tissue infection ____________________________________________   LABS (all labs ordered are listed, but only abnormal results are displayed)  Labs Reviewed  COMPREHENSIVE METABOLIC PANEL - Abnormal; Notable for the following components:      Result Value   Glucose, Bld 113 (*)    Total Protein 8.3 (*)    All other components within normal limits  CBC - Abnormal; Notable for the  following components:   WBC 15.1 (*)    All other components within normal limits  URINALYSIS, COMPLETE (UACMP) WITH MICROSCOPIC - Abnormal; Notable for the following components:   Color, Urine YELLOW (*)    APPearance CLEAR (*)    All other components within normal limits  LIPASE, BLOOD  INFLUENZA PANEL BY PCR (TYPE A & B)    Lab work reviewed by me shows elevated white count which is nonspecific and could be secondary to pain versus infection __________________________________________  EKG   ____________________________________________  RADIOLOGY  CT abdomen pelvis reviewed by me consistent with uncomplicated sigmoid diverticulitis ____________________________________________   PROCEDURES  Procedure(s) performed: no  Procedures  Critical Care performed: no  Observation: no ____________________________________________   INITIAL IMPRESSION / ASSESSMENT AND PLAN / ED COURSE  Pertinent labs & imaging results that were available during my care of the patient were reviewed by me and considered in my medical decision making (see chart for details).  The  patient arrives with fever abdominal pain and constipation.  Differential is broad but given his advanced age and difficulty of his abdominal exam he requires a CT scan abdomen pelvis.  Pain control will his flu test and CT are pending.  The patient's flu test is negative and his CT scan shows uncomplicated sigmoid diverticulitis.  Had a lengthy discussion with the patient regarding the diagnosis and outpatient treatment and he agrees.  First dose of Augmentin now and I will discharge him home with a 2-week course as well as a bowel regimen and refer him to general surgery.  He is discharged home in improved condition with strict return precautions verbalizes understanding and agreement with the plan.      ____________________________________________   FINAL CLINICAL IMPRESSION(S) / ED DIAGNOSES  Final diagnoses:    Sigmoid diverticulitis      NEW MEDICATIONS STARTED DURING THIS VISIT:  Discharge Medication List as of 12/01/2017  8:41 PM    START taking these medications   Details  amoxicillin-clavulanate (AUGMENTIN) 875-125 MG tablet Take 1 tablet by mouth 2 (two) times daily for 14 days., Starting Sun 12/01/2017, Until Sun 12/15/2017, Print    HYDROcodone-acetaminophen (NORCO) 5-325 MG tablet Take 1 tablet by mouth every 6 (six) hours as needed for up to 15 doses for severe pain., Starting Sun 12/01/2017, Print         Note:  This document was prepared using Dragon voice recognition software and may include unintentional dictation errors.     Merrily Brittleifenbark, Aymara Sassi, MD 12/05/17 1340

## 2017-12-01 NOTE — Discharge Instructions (Signed)
Please take all of your antibiotics as prescribed and make an appointment to follow-up with the general surgeon this coming week for reevaluation.  Return to the emergency department sooner for any new or worsening symptoms such as fevers, chills, worsening pain, if you cannot eat or drink, or for any other issues whatsoever.  It was a pleasure to take care of you today, and thank you for coming to our emergency department.  If you have any questions or concerns before leaving please ask the nurse to grab me and I'm more than happy to go through your aftercare instructions again.  If you were prescribed any opioid pain medication today such as Norco, Vicodin, Percocet, morphine, hydrocodone, or oxycodone please make sure you do not drive when you are taking this medication as it can alter your ability to drive safely.  If you have any concerns once you are home that you are not improving or are in fact getting worse before you can make it to your follow-up appointment, please do not hesitate to call 911 and come back for further evaluation.  Merrily Brittle, MD  Results for orders placed or performed during the hospital encounter of 12/01/17  Lipase, blood  Result Value Ref Range   Lipase 24 11 - 51 U/L  Comprehensive metabolic panel  Result Value Ref Range   Sodium 135 135 - 145 mmol/L   Potassium 3.8 3.5 - 5.1 mmol/L   Chloride 102 101 - 111 mmol/L   CO2 24 22 - 32 mmol/L   Glucose, Bld 113 (H) 65 - 99 mg/dL   BUN 19 6 - 20 mg/dL   Creatinine, Ser 1.61 0.61 - 1.24 mg/dL   Calcium 9.4 8.9 - 09.6 mg/dL   Total Protein 8.3 (H) 6.5 - 8.1 g/dL   Albumin 4.2 3.5 - 5.0 g/dL   AST 26 15 - 41 U/L   ALT 28 17 - 63 U/L   Alkaline Phosphatase 63 38 - 126 U/L   Total Bilirubin 0.7 0.3 - 1.2 mg/dL   GFR calc non Af Amer >60 >60 mL/min   GFR calc Af Amer >60 >60 mL/min   Anion gap 9 5 - 15  CBC  Result Value Ref Range   WBC 15.1 (H) 3.8 - 10.6 K/uL   RBC 4.80 4.40 - 5.90 MIL/uL   Hemoglobin  14.0 13.0 - 18.0 g/dL   HCT 04.5 40.9 - 81.1 %   MCV 85.4 80.0 - 100.0 fL   MCH 29.2 26.0 - 34.0 pg   MCHC 34.2 32.0 - 36.0 g/dL   RDW 91.4 78.2 - 95.6 %   Platelets 306 150 - 440 K/uL  Urinalysis, Complete w Microscopic  Result Value Ref Range   Color, Urine YELLOW (A) YELLOW   APPearance CLEAR (A) CLEAR   Specific Gravity, Urine 1.025 1.005 - 1.030   pH 5.0 5.0 - 8.0   Glucose, UA NEGATIVE NEGATIVE mg/dL   Hgb urine dipstick NEGATIVE NEGATIVE   Bilirubin Urine NEGATIVE NEGATIVE   Ketones, ur NEGATIVE NEGATIVE mg/dL   Protein, ur NEGATIVE NEGATIVE mg/dL   Nitrite NEGATIVE NEGATIVE   Leukocytes, UA NEGATIVE NEGATIVE   RBC / HPF 0-5 0 - 5 RBC/hpf   WBC, UA 0-5 0 - 5 WBC/hpf   Bacteria, UA NONE SEEN NONE SEEN   Squamous Epithelial / LPF NONE SEEN NONE SEEN   Mucus PRESENT   Influenza panel by PCR (type A & B)  Result Value Ref Range   Influenza A  By PCR NEGATIVE NEGATIVE   Influenza B By PCR NEGATIVE NEGATIVE   Ct Abdomen Pelvis W Contrast  Result Date: 12/01/2017 CLINICAL DATA:  Abdominal pain, groin pain.  Constipation and fever. EXAM: CT ABDOMEN AND PELVIS WITH CONTRAST TECHNIQUE: Multidetector CT imaging of the abdomen and pelvis was performed using the standard protocol following bolus administration of intravenous contrast. CONTRAST:  125mL ISOVUE-300 IOPAMIDOL (ISOVUE-300) INJECTION 61% COMPARISON:  None. FINDINGS: Lower chest: Clear lung bases.  Heart normal size. Hepatobiliary: There several small low-density liver lesions, most too small to characterize. The largest lies in segment 4 a measuring 1 cm, consistent with a cyst. Liver shows mild diffuse fatty infiltration. Liver is normal in size. Normal gallbladder. No bile duct dilation. Pancreas: Unremarkable. No pancreatic ductal dilatation or surrounding inflammatory changes. Spleen: Normal in size without focal abnormality. Adrenals/Urinary Tract: No adrenal masses. Kidneys are normal in size, orientation and position. There  is symmetric renal enhancement and excretion. Small nonobstructing stone noted in the lower pole of the left kidney. Subcentimeter exophytic mass arises from the mid to upper pole the right kidney consistent with cysts. No other renal masses or stones. No hydronephrosis. Normal ureters. Bladder is unremarkable, but partly obscured by artifact from a right hip arthroplasty. Stomach/Bowel: There is wall thickening of a segment of the mid sigmoid colon with surrounding inflammation. There are several diverticula along this segment of the sigmoid colon. There is no extraluminal air. There is no fluid collection to suggest an abscess. The proximal sigmoid colon enters a left inguinal hernia. There is no incarceration, strangulation or obstruction. There are several other scattered left colon diverticula without inflammation. Remainder of the colon is unremarkable. Stomach and small bowel are within normal limits. Normal appendix is visualized. Vascular/Lymphatic: Aortic atherosclerosis. No enlarged abdominal or pelvic lymph nodes. Reproductive: Unremarkable. Other: No ascites. Musculoskeletal: Right total hip arthroplasty appears well seated well aligned. There are advanced arthropathic changes of the left hip. No fracture or acute finding. No osteoblastic or osteolytic lesions. IMPRESSION: 1. Acute sigmoid colon diverticulitis. No extraluminal air. No abscess. 2. Left inguinal hernia contains a segment of the proximal sigmoid colon. No colonic obstruction. No strangulation or incarceration. 3. Several small low-density liver lesions, most likely cysts. 4. Small nonobstructing stone in the lower pole the left kidney. 5. Mild aortic atherosclerosis. Electronically Signed   By: Amie Portlandavid  Ormond M.D.   On: 12/01/2017 20:01   [place] Scrotum W/doppler  Result Date: 11/21/2017 CLINICAL DATA:  Enlarged left hemiscrotum, several weeks duration. EXAM: SCROTAL ULTRASOUND DOPPLER ULTRASOUND OF THE TESTICLES TECHNIQUE: Complete  ultrasound examination of the testicles, epididymis, and other scrotal structures was performed. Color and spectral Doppler ultrasound were also utilized to evaluate blood flow to the testicles. COMPARISON:  None. FINDINGS: Right testicle Measurements: 5.2 x 3.0 x 3.4 cm. No mass lesion. Normal color flow pattern. Left testicle Measurements: 4.8 x 3.0 x 3.6 cm. No mass lesion. Normal color flow pattern. Right epididymis: Several small epididymal cysts, the largest 5 mm. Not significant. No hyperemia. Left epididymis:  Normal in size and appearance. Hydrocele: Bilateral hydroceles, left larger than right, probably explaining the clinical presentation. Varicocele:  Small left varicocele, not likely significant. Pulsed Doppler interrogation of both testes demonstrates normal low resistance arterial and venous waveforms bilaterally. IMPRESSION: Bilateral hydroceles, larger on the left than the right, probably explaining the clinical presentation. Testicles appear intrinsically normal. Few small insignificant right epididymal cysts. Electronically Signed   By: Paulina FusiMark  Shogry M.D.   On: 11/21/2017 16:17

## 2017-12-01 NOTE — ED Triage Notes (Signed)
Pt c/o lower abd pain with constipation, and fever for the past 2-3 days. States he took Doculax with no relief,had a couple small loose stools today..Marland Kitchen

## 2017-12-01 NOTE — ED Notes (Signed)
FIRST NURSE NOTE:  Pt c/o abdominal pain, groin pain, constipation, and fever. Pt ambulatory in lobby without difficulty.

## 2017-12-09 DIAGNOSIS — M1990 Unspecified osteoarthritis, unspecified site: Secondary | ICD-10-CM | POA: Insufficient documentation

## 2017-12-09 DIAGNOSIS — I10 Essential (primary) hypertension: Secondary | ICD-10-CM | POA: Insufficient documentation

## 2017-12-09 DIAGNOSIS — F17200 Nicotine dependence, unspecified, uncomplicated: Secondary | ICD-10-CM | POA: Insufficient documentation

## 2017-12-09 DIAGNOSIS — R7989 Other specified abnormal findings of blood chemistry: Secondary | ICD-10-CM | POA: Insufficient documentation

## 2017-12-09 DIAGNOSIS — E119 Type 2 diabetes mellitus without complications: Secondary | ICD-10-CM | POA: Insufficient documentation

## 2017-12-12 ENCOUNTER — Encounter: Payer: Self-pay | Admitting: Surgery

## 2017-12-12 ENCOUNTER — Ambulatory Visit (INDEPENDENT_AMBULATORY_CARE_PROVIDER_SITE_OTHER): Admitting: Surgery

## 2017-12-12 VITALS — BP 151/81 | HR 82 | Temp 98.4°F | Ht 73.0 in | Wt 362.5 lb

## 2017-12-12 DIAGNOSIS — K5732 Diverticulitis of large intestine without perforation or abscess without bleeding: Secondary | ICD-10-CM | POA: Diagnosis not present

## 2017-12-12 DIAGNOSIS — K4090 Unilateral inguinal hernia, without obstruction or gangrene, not specified as recurrent: Secondary | ICD-10-CM

## 2017-12-12 NOTE — Patient Instructions (Addendum)
Please continue to take your Antibiotic until gone.  We will send the referral to [facility] for you to have a Colonoscopy done. Someone from their office will call you within 5-7 days to schedule an appointment. If you do not hear from anyone please call our office and let us know.   Please call our office to schedule a follow up with Dr.Davis after the Colonoscopy has been done.

## 2017-12-12 NOTE — Progress Notes (Signed)
Surgical Clinic History and Physical  Referring provider:  Marisue Ivan, MD [phone] [facility] [address] [facility] [place], [place] [address]  HISTORY OF PRESENT ILLNESS (HPI):  62 y.o. male presents for evaluation of diverticulitis and Left inguinal hernia. Patient reports he presented 11 days ago to [facility] ED for moderate intermittent crampy lower abdominal pain and low-grade fever, for which CT imaging was performed and demonstrated uncomplicated sigmoid colonic diverticulitis (without abscess). WBC was also found to be 15.1. He states his pain initially began at his LLQ and then progressed across his lower abdomen. Since his pain was well-controlled and improved with a single dose of morphine, he was able to be discharged home with a prescription for Augmentin x 2 weeks and outpatient surgical follow-up. Though patient describes a history of intermittent constipation, he reports +flatus and +BM's WNL since starting antibiotics. He otherwise denies any further fever/chills, N/V, CP, or SOB and states his most recent colonoscopy was 10 years ago, acknowledging that he is due for one.  Of note, patient also complained of Left groin swelling at [facility] ED.   PAST MEDICAL HISTORY (PMH):  Past Medical History:  Diagnosis Date  . Arthritis   . Diabetes mellitus    diet control  . Hypertension   . Sleep apnea      PAST SURGICAL HISTORY [facility]  Past Surgical History:  Procedure Laterality Date  . HIP SURGERY    . NASAL SEPTUM SURGERY    . TONSILLECTOMY    . TOTAL HIP ARTHROPLASTY Right 02/14/2016   Procedure: TOTAL HIP ARTHROPLASTY ANTERIOR APPROACH;  Surgeon: Kennedy Bucker, MD;  Location: [facility] ORS;  Service: Orthopedics;  Laterality: Right;     MEDICATIONS:  Prior to Admission medications   Medication Sig Start Date End Date Taking? Authorizing Provider  acetaminophen (TYLENOL) 500 MG tablet Take 500 mg by mouth every 6 (six) hours as needed.   Yes [provider]   amoxicillin-clavulanate (AUGMENTIN) 875-125 MG tablet Take 1 tablet by mouth 2 (two) times daily for 14 days. 12/01/17 12/15/17 Yes Merrily Brittle, MD  aspirin EC 81 MG tablet Take by mouth.   Yes [provider]  aspirin-acetaminophen-caffeine (EXCEDRIN MIGRAINE) [phone] MG per tablet Take 2 tablets by mouth every 6 (six) hours as needed. For headache.   Yes [provider]  cetirizine (ZYRTEC) 10 MG tablet Take by mouth.   Yes [provider]  enoxaparin (LOVENOX) 40 MG/0.4ML injection Inject 0.4 mLs (40 mg total) into the skin daily. 02/17/16  Yes Evon Slack, PA-C  fluticasone (FLONASE) 50 MCG/ACT nasal spray Place into the nose.   Yes [provider]  ibuprofen (ADVIL,MOTRIN) 600 MG tablet Take 600 mg by mouth every 8 (eight) hours as needed.   Yes [provider]  losartan (COZAAR) 100 MG tablet Take 100 mg by mouth at bedtime.   Yes [provider]  metFORMIN (GLUCOPHAGE) 500 MG tablet  09/11/17  Yes [provider]  [facility] DELICA LANCETS FINE MISC Use 1 each as directed. Check CBG's fasting once daily. Dx: E11.9 07/27/16  Yes [provider]     ALLERGIES:  Allergies  Allergen Reactions  . Tramadol Other (See Comments)    "increases pain"     SOCIAL HISTORY:  Social History   Socioeconomic History  . Marital status: Married    Spouse name: Not on file  . Number of children: Not on file  . Years of education: Not on file  . Highest education level: Not  on file  Occupational History  . Not on file  Social Needs  . Financial resource strain: Not on file  . Food insecurity:    Worry: Not on file    Inability: Not on file  . Transportation needs:    Medical: Not on file    Non-medical: Not on file  Tobacco Use  . Smoking status: Current Every Day Smoker    Packs/day: 1.00  . Smokeless tobacco: Never Used  Substance and Sexual Activity  . Alcohol use: No  . Drug use: No  . Sexual activity:  Not on file  Lifestyle  . Physical activity:    Days per week: Not on file    Minutes per session: Not on file  . Stress: Not on file  Relationships  . Social connections:    Talks on phone: Not on file    Gets together: Not on file    Attends religious service: Not on file    Active member of club or organization: Not on file    Attends meetings of clubs or organizations: Not on file    Relationship status: Not on file  . Intimate partner violence:    Fear of current or ex partner: Not on file    Emotionally abused: Not on file    Physically abused: Not on file    Forced sexual activity: Not on file  Other Topics Concern  . Not on file  Social History Narrative  . Not on file    The patient currently resides (home / rehab facility / nursing home): Home The patient normally is (ambulatory / bedbound): Ambulatory  FAMILY HISTORY:  Family History  Problem Relation Age of Onset  . Kidney disease Paternal Grandfather   . Prostate cancer Neg Hx   . Bladder Cancer Neg Hx   . Kidney cancer Neg Hx     Otherwise negative/non-contributory.  REVIEW OF SYSTEMS:  Constitutional: denies any other weight loss, fever, chills, or sweats  Eyes: denies any other vision changes, history of eye injury  ENT: denies sore throat, hearing problems  Respiratory: denies shortness of breath, wheezing  Cardiovascular: denies chest pain, palpitations  Gastrointestinal: abdominal pain, N/V, and bowel function as per HPI Musculoskeletal: denies any other joint pains or cramps  Skin: Denies any other rashes or skin discolorations Neurological: denies any other headache, dizziness, weakness  Psychiatric: Denies any other depression, anxiety   All other review of systems were otherwise negative   VITAL SIGNS:  BP (!) 151/81   Pulse 82   Temp 98.4 F (36.9 C) (Oral)   Ht 6\' 1"  (1.854 m)   Wt (!) 362 lb 8 oz (164.4 kg)   BMI 47.83 kg/m   PHYSICAL EXAM:  Constitutional:  -- Morbidly obese  body habitus  -- Awake, alert, and oriented x3  Eyes:  -- Pupils equally round and reactive to light  -- No scleral icterus  Ear, nose, throat:  -- No jugular venous distension -- No nasal drainage, bleeding Pulmonary:  -- No crackles  -- Equal breath sounds bilaterally -- Breathing non-labored at rest Cardiovascular:  -- S1, S2 present  -- No pericardial rubs  Gastrointestinal:  -- Abdomen soft, nontender, non-distended, no guarding/rebound  -- No abdominal masses appreciated, pulsatile or otherwise  Musculoskeletal and Integumentary:  -- Wounds or skin discoloration: None appreciated -- Extremities: B/L UE and LE FROM, hands and feet warm, no edema  Neurologic:  -- Motor function: Intact and symmetric -- Sensation: Intact and symmetric  Labs:  CBC Latest Ref Rng & Units 12/01/2017 02/16/2016 02/15/2016  WBC 3.8 - 10.6 K/uL 15.1(H) - 10.5  Hemoglobin 13.0 - 18.0 g/dL 16.1 0.9(U) 10.6(L)  Hematocrit 40.0 - 52.0 % 41.0 - 31.4(L)  Platelets 150 - 440 K/uL 306 - 215   CMP Latest Ref Rng & Units 12/01/2017 02/15/2016 02/14/2016  Glucose 65 - 99 mg/dL 045(W) 098(J) -  BUN 6 - 20 mg/dL 19 18 -  Creatinine 1.91 - 1.24 mg/dL 4.78 2.95 6.21  Sodium 135 - 145 mmol/L 135 136 -  Potassium 3.5 - 5.1 mmol/L 3.8 3.7 -  Chloride 101 - 111 mmol/L 102 107 -  CO2 22 - 32 mmol/L 24 25 -  Calcium 8.9 - 10.3 mg/dL 9.4 3.0(Q) -  Total Protein 6.5 - 8.1 g/dL 8.3(H) - -  Total Bilirubin 0.3 - 1.2 mg/dL 0.7 - -  Alkaline Phos 38 - 126 U/L 63 - -  AST 15 - 41 U/L 26 - -  ALT 17 - 63 U/L 28 - -    Imaging studies:  CT Abdomen and Pelvis with Contrast (12/01/2017) - personally reviewed and discussed with patient . Acute sigmoid colon diverticulitis. No extraluminal air. No abscess. 2. Left inguinal hernia contains a segment of the proximal sigmoid colon. No colonic obstruction. No strangulation or incarceration. 3. Several small low-density liver lesions, most likely cysts. 4. Small nonobstructing  stone in the lower pole the left kidney. 5. Mild aortic atherosclerosis.   Assessment/Plan:  62 y.o. male with first episode of uncomplicated acute sigmoid colonic diverticulitis improving with outpatient oral antibiotics and symptomatic sigmoid colon-containing Left inguinal hernia without evidence to suggest obstruction, complicated by co-morbidities including morbid obesity (BMI 48), DM, HTN, OSA (advised CPAP, but does not use), osteoarthritis, and Left testicular hydrocele.   - complete prescribed course of antibiotics even if feeling better/well   - ensure adequate hydration, easy-to-digest foods x 4 more weeks, then high-fiber diet + once daily Colace stool softener (may hold if develops loose BM's)  - considering recent sigmoid colonic diverticulitis and due for screening colonoscopy, will refer for colonoscopy accordingly  - also discussed partial colectomy and indications for elective vs emergent surgery  - return to clinic following colonoscopy to discuss results and Left inguinal hernia  - instructed to call if any questions or concerns  - weight loss discussed and encouraged  All of the above recommendations were discussed with the patient, and all of patient's questions were answered to his expressed satisfaction.  Thank you for the opportunity to participate in this patient's care.  -- Scherrie Gerlach Earlene Plater, MD, RPVI [place]: [facility] General Surgery - Partnering for exceptional care. Office: [phone]

## 2017-12-17 ENCOUNTER — Encounter: Payer: Self-pay | Admitting: Surgery

## 2017-12-19 ENCOUNTER — Other Ambulatory Visit: Payer: Self-pay

## 2017-12-19 ENCOUNTER — Telehealth: Payer: Self-pay

## 2017-12-19 DIAGNOSIS — K5732 Diverticulitis of large intestine without perforation or abscess without bleeding: Secondary | ICD-10-CM

## 2017-12-19 MED ORDER — NA SULFATE-K SULFATE-MG SULF 17.5-3.13-1.6 GM/177ML PO SOLN: 1.0000 | Freq: Once | ORAL | 0 refills | Status: AC

## 2017-12-19 NOTE — Telephone Encounter (Signed)
Gastroenterology Pre-Procedure Review  Request Date:  Requesting Physician: Dr.   PATIENT REVIEW QUESTIONS: The patient responded to the following health history questions as indicated:    1. Are you having any GI issues? yes (Diverticulitis) 2. Do you have a personal history of Polyps? no 3. Do you have a family history of Colon Cancer or Polyps? no 4. Diabetes Mellitus? no 5. Joint replacements in the past 12 months?no 6. Major health problems in the past 3 months?no 7. Any artificial heart valves, MVP, or defibrillator?no    MEDICATIONS & ALLERGIES:    Patient reports the following regarding taking any anticoagulation/antiplatelet therapy:   Plavix, Coumadin, Eliquis, Xarelto, Lovenox, Pradaxa, Brilinta, or Effient? no Aspirin? yes (ASA 19m)  Patient confirms/reports the following medications:  Current Outpatient Medications  Medication Sig Dispense Refill  . acetaminophen (TYLENOL) 500 MG tablet Take 500 mg by mouth every 6 (six) hours as needed.    .Marland Kitchenaspirin EC 81 MG tablet Take by mouth.    .Marland Kitchenaspirin-acetaminophen-caffeine (EXCEDRIN MIGRAINE) 250-250-65 MG per tablet Take 2 tablets by mouth every 6 (six) hours as needed. For headache.    . cetirizine (ZYRTEC) 10 MG tablet Take by mouth.    . enoxaparin (LOVENOX) 40 MG/0.4ML injection Inject 0.4 mLs (40 mg total) into the skin daily. 14 Syringe 0  . fluticasone (FLONASE) 50 MCG/ACT nasal spray Place into the nose.    . ibuprofen (ADVIL,MOTRIN) 600 MG tablet Take 600 mg by mouth every 8 (eight) hours as needed.    .Marland Kitchenlosartan (COZAAR) 100 MG tablet Take 100 mg by mouth at bedtime.    . metFORMIN (GLUCOPHAGE) 500 MG tablet     . Na Sulfate-K Sulfate-Mg Sulf 17.5-3.13-1.6 GM/177ML SOLN Take 1 kit by mouth once for 1 dose. 354 mL 0  . ONETOUCH DELICA LANCETS FINE MISC Use 1 each as directed. Check CBG's fasting once daily. Dx: E11.9     No current facility-administered medications for this visit.     Patient confirms/reports  the following allergies:  Allergies  Allergen Reactions  . Tramadol Other (See Comments)    "increases pain"    No orders of the defined types were placed in this encounter.   AUTHORIZATION INFORMATION Primary Insurance: 1D#: Group #:  Secondary Insurance: 1D#: Group #:  SCHEDULE INFORMATION: Date: 12/30/17 Time: Location: [facility]

## 2017-12-23 ENCOUNTER — Encounter: Payer: Self-pay | Admitting: Anesthesiology

## 2017-12-23 NOTE — Anesthesia Preprocedure Evaluation (Deleted)
Anesthesia Evaluation    Airway        Dental   Pulmonary sleep apnea , Current Smoker,           Cardiovascular hypertension,      Neuro/Psych    GI/Hepatic   Endo/Other  diabetes, Type obesity  Renal/GU      Musculoskeletal  (+) Arthritis ,   Abdominal   Peds  Hematology   Anesthesia Other Findings   Reproductive/Obstetrics                             Anesthesia Physical Anesthesia Plan Anesthesia Quick Evaluation

## 2017-12-30 ENCOUNTER — Ambulatory Visit: Admission: RE | Admit: 2017-12-30 | Source: Ambulatory Visit | Admitting: Gastroenterology

## 2017-12-30 SURGERY — COLONOSCOPY WITH PROPOFOL
Anesthesia: Choice

## 2018-01-02 ENCOUNTER — Telehealth: Payer: Self-pay

## 2018-01-02 NOTE — Telephone Encounter (Signed)
Spoke with patient at this time to reschedule his colonoscopy. Patient stated due to scheduling conflict he cancelled his Colonoscopy and would like to cancell follow up with DR.Davis as well.  Patient stated he will notify our office when he schedules his colonoscopy after he meets with his primary care doctor.

## 2018-01-17 ENCOUNTER — Telehealth: Payer: Self-pay

## 2018-01-17 ENCOUNTER — Ambulatory Visit: Payer: Self-pay | Admitting: Surgery

## 2018-01-17 NOTE — Telephone Encounter (Signed)
Left message for patient to call office.  Patient was scheduled to have Colonoscopy 12/30/17-Cancelled, has not rescheduled at this time.  Patient was scheduled to see Dr.Dav4/24/19 cancelled, due to not having Colonoscopy.   We need to get the above appointments rescheduled .

## 2018-03-12 ENCOUNTER — Encounter: Payer: Self-pay | Admitting: *Deleted

## 2018-03-12 ENCOUNTER — Other Ambulatory Visit: Payer: Self-pay

## 2018-03-12 ENCOUNTER — Ambulatory Visit
Admission: RE | Admit: 2018-03-12 | Discharge: 2018-03-12 | Disposition: A | Discharge status: Home / Self Care | Source: Ambulatory Visit | Attending: Internal Medicine | Admitting: Internal Medicine

## 2018-03-12 ENCOUNTER — Ambulatory Visit: Admitting: Anesthesiology

## 2018-03-12 ENCOUNTER — Encounter
Admission: RE | Disposition: A | Discharge status: Home / Self Care | Payer: Self-pay | Source: Ambulatory Visit | Attending: Internal Medicine

## 2018-03-12 DIAGNOSIS — E119 Type 2 diabetes mellitus without complications: Secondary | ICD-10-CM | POA: Diagnosis not present

## 2018-03-12 DIAGNOSIS — R194 Change in bowel habit: Secondary | ICD-10-CM | POA: Insufficient documentation

## 2018-03-12 DIAGNOSIS — Z7982 Long term (current) use of aspirin: Secondary | ICD-10-CM | POA: Insufficient documentation

## 2018-03-12 DIAGNOSIS — Z79899 Other long term (current) drug therapy: Secondary | ICD-10-CM | POA: Insufficient documentation

## 2018-03-12 DIAGNOSIS — I10 Essential (primary) hypertension: Secondary | ICD-10-CM | POA: Insufficient documentation

## 2018-03-12 DIAGNOSIS — K640 First degree hemorrhoids: Secondary | ICD-10-CM | POA: Diagnosis not present

## 2018-03-12 DIAGNOSIS — K5730 Diverticulosis of large intestine without perforation or abscess without bleeding: Secondary | ICD-10-CM | POA: Insufficient documentation

## 2018-03-12 DIAGNOSIS — Z6841 Body Mass Index (BMI) 40.0 and over, adult: Secondary | ICD-10-CM | POA: Diagnosis not present

## 2018-03-12 DIAGNOSIS — Z9989 Dependence on other enabling machines and devices: Secondary | ICD-10-CM | POA: Diagnosis not present

## 2018-03-12 DIAGNOSIS — Z7951 Long term (current) use of inhaled steroids: Secondary | ICD-10-CM | POA: Diagnosis not present

## 2018-03-12 DIAGNOSIS — G4730 Sleep apnea, unspecified: Secondary | ICD-10-CM | POA: Diagnosis not present

## 2018-03-12 DIAGNOSIS — Z7984 Long term (current) use of oral hypoglycemic drugs: Secondary | ICD-10-CM | POA: Diagnosis not present

## 2018-03-12 DIAGNOSIS — Z09 Encounter for follow-up examination after completed treatment for conditions other than malignant neoplasm: Secondary | ICD-10-CM | POA: Insufficient documentation

## 2018-03-12 HISTORY — PX: COLONOSCOPY WITH PROPOFOL: SHX5780

## 2018-03-12 HISTORY — DX: Diverticulitis of intestine, part unspecified, without perforation or abscess without bleeding: K57.92

## 2018-03-12 LAB — GLUCOSE, CAPILLARY: Glucose-Capillary: 129 mg/dL — ABNORMAL HIGH (ref 65–99)

## 2018-03-12 SURGERY — COLONOSCOPY WITH PROPOFOL
Anesthesia: General

## 2018-03-12 MED ORDER — LIDOCAINE 2% (20 MG/ML) 5 ML SYRINGE
INTRAMUSCULAR | Status: DC | PRN
  Administered 2018-03-12: 30 mg via INTRAVENOUS

## 2018-03-12 MED ORDER — FENTANYL CITRATE (PF) 100 MCG/2ML IJ SOLN
INTRAMUSCULAR | Status: DC | PRN
  Administered 2018-03-12 (×2): 50 ug via INTRAVENOUS

## 2018-03-12 MED ORDER — PROPOFOL 500 MG/50ML IV EMUL
INTRAVENOUS | Status: AC
  Filled 2018-03-12: qty 50

## 2018-03-12 MED ORDER — PROPOFOL 500 MG/50ML IV EMUL
INTRAVENOUS | Status: DC | PRN
  Administered 2018-03-12: 160 ug/kg/min via INTRAVENOUS

## 2018-03-12 MED ORDER — FENTANYL CITRATE (PF) 100 MCG/2ML IJ SOLN
INTRAMUSCULAR | Status: AC
  Filled 2018-03-12: qty 2

## 2018-03-12 MED ORDER — LIDOCAINE HCL (PF) 2 % IJ SOLN
INTRAMUSCULAR | Status: AC
  Filled 2018-03-12: qty 10

## 2018-03-12 MED ORDER — PROPOFOL 10 MG/ML IV BOLUS
INTRAVENOUS | Status: AC
  Filled 2018-03-12: qty 20

## 2018-03-12 MED ORDER — PROPOFOL 10 MG/ML IV BOLUS
INTRAVENOUS | Status: DC | PRN
  Administered 2018-03-12: 100 mg via INTRAVENOUS

## 2018-03-12 MED ORDER — PHENYLEPHRINE HCL 10 MG/ML IJ SOLN
INTRAMUSCULAR | Status: AC
  Filled 2018-03-12: qty 1

## 2018-03-12 MED ORDER — SODIUM CHLORIDE 0.9 % IV SOLN
INTRAVENOUS | Status: DC
  Administered 2018-03-12 (×2): via INTRAVENOUS

## 2018-03-12 MED ORDER — SODIUM CHLORIDE 0.9 % IJ SOLN
INTRAMUSCULAR | Status: AC
  Filled 2018-03-12: qty 10

## 2018-03-12 NOTE — H&P (Signed)
Outpatient short stay form Pre-procedure 03/12/2018 8:12 AM Jerome Cunningham K. Norma Fredricksonoledo, M.D.  Primary Physician: Iantha FallenKahnka Linthavong, M.D.  Reason for visit:  F/u diverticulitis  History of present illness:  Patient recently had a bout of diverticulitis 6-8 weeks ago. Denies further abdominal pain, change in bowel habits, or rectal bleeding.    Current Facility-Administered Medications:  .  0.9 %  sodium chloride infusion, , Intravenous, Continuous, [place]edo, Boykin Nearingeodoro K, MD, Last Rate: 20 mL/hr at 03/12/18 0734  Medications Prior to Admission  Medication Sig Dispense Refill Last Dose  . acetaminophen (TYLENOL) 500 MG tablet Take 500 mg by mouth every 6 (six) hours as needed.   Past Week at Unknown time  . aspirin EC 81 MG tablet Take by mouth.   Past Week at Unknown time  . aspirin-acetaminophen-caffeine (EXCEDRIN MIGRAINE) 250-250-65 MG per tablet Take 2 tablets by mouth every 6 (six) hours as needed. For headache.   Past Week at Unknown time  . cetirizine (ZYRTEC) 10 MG tablet Take by mouth.   Past Month at Unknown time  . fluticasone (FLONASE) 50 MCG/ACT nasal spray Place into the nose.   Past Month at Unknown time  . ibuprofen (ADVIL,MOTRIN) 600 MG tablet Take 600 mg by mouth every 8 (eight) hours as needed.   Past Month at Unknown time  . losartan (COZAAR) 100 MG tablet Take 100 mg by mouth at bedtime.   03/11/2018 at 2130  . metFORMIN (GLUCOPHAGE) 500 MG tablet 500 mg daily with breakfast.    Past Week at Unknown time  . ONETOUCH DELICA LANCETS FINE MISC Use 1 each as directed. Check CBG's fasting once daily. Dx: E11.9   03/11/2018 at Unknown time  . enoxaparin (LOVENOX) 40 MG/0.4ML injection Inject 0.4 mLs (40 mg total) into the skin daily. (Patient not taking: Reported on 12/23/2017) 14 Syringe 0 Completed Course at Unknown time     Allergies  Allergen Reactions  . Tramadol Other (See Comments)    "increases pain"     Past Medical History:  Diagnosis Date  . Arthritis    hands, knees and  hips  . Diabetes mellitus    diet control  . Diverticulitis   . Hypertension   . Sleep apnea    uses CPAP    Review of systems:  Otherwise negative.    Physical Exam  Gen: Alert, oriented. Appears stated age.  HEENT: [place]/AT. PERRLA. Lungs: CTA, no wheezes. CV: RR nl S1, S2. Abd: soft, benign, no masses. BS+ Ext: No edema. Pulses 2+    Planned procedures: Proceed with colonoscopy. The patient understands the nature of the planned procedure, indications, risks, alternatives and potential complications including but not limited to bleeding, infection, perforation, damage to internal organs and possible oversedation/side effects from anesthesia. The patient agrees and gives consent to proceed.  Please refer to procedure notes for findings, recommendations and patient disposition/instructions.     Maytal Mijangos K. Norma Fredricksonoledo, M.D. Gastroenterology 03/12/2018  8:12 AM

## 2018-03-12 NOTE — Interval H&P Note (Signed)
History and Physical Interval Note:  03/12/2018 8:14 AM  Jerome Cunningham  has presented today for surgery, with the diagnosis of F/U Diverticulitis  The various methods of treatment have been discussed with the patient and family. After consideration of risks, benefits and other options for treatment, the patient has consented to  Procedure(s): COLONOSCOPY WITH PROPOFOL (N/A) as a surgical intervention .  The patient's history has been reviewed, patient examined, no change in status, stable for surgery.  I have reviewed the patient's chart and labs.  Questions were answered to the patient's satisfaction.     Congeroledo, [place]roquo[place]odoro

## 2018-03-12 NOTE — Anesthesia Preprocedure Evaluation (Addendum)
Anesthesia Evaluation  Patient identified by MRN, date of birth, ID band Patient awake    Reviewed: Allergy & Precautions, H&P , NPO status , Patient's Chart, lab work & pertinent test results, reviewed documented beta blocker date and time   History of Anesthesia Complications Negative for: history of anesthetic complications  Airway Mallampati: III   Neck ROM: full    Dental  (+) Teeth Intact, Dental Advidsory Given, Caps   Pulmonary neg shortness of breath, sleep apnea and Continuous Positive Airway Pressure Ventilation , neg COPD, neg recent URI, Current Smoker,           Cardiovascular hypertension, (-) angina(-) CAD, (-) Past MI, (-) Cardiac Stents and (-) CABG (-) dysrhythmias (-) Valvular Problems/Murmurs     Neuro/Psych negative neurological ROS  negative psych ROS   GI/Hepatic negative GI ROS, Neg liver ROS,   Endo/Other  diabetes, Well ControlledMorbid obesity  Renal/GU negative Renal ROS  negative genitourinary   Musculoskeletal   Abdominal   Peds  Hematology negative hematology ROS (+)   Anesthesia Other Findings Past Medical History:   Arthritis                                                    Diabetes mellitus                                              Comment:diet control   Hypertension                                                 Sleep apnea                                                Past Surgical History:   HIP SURGERY                                                   TONSILLECTOMY                                                 NASAL SEPTUM SURGERY                                          Reproductive/Obstetrics                            Anesthesia Physical  Anesthesia Plan  ASA: III  Anesthesia Plan: General   Post-op Pain Management:    Induction: Intravenous  PONV Risk Score and Plan: 1 and Propofol infusion  Airway Management Planned: Nasal  Cannula  Additional Equipment:   Intra-op Plan:   Post-operative Plan:   Informed Consent: I have reviewed the patients History and Physical, chart, labs and discussed the procedure including the risks, benefits and alternatives for the proposed anesthesia with the patient or authorized representative who has indicated his/her understanding and acceptance.   Dental Advisory Given  Plan Discussed with: CRNA  Anesthesia Plan Comments:         Anesthesia Quick Evaluation

## 2018-03-12 NOTE — Transfer of Care (Signed)
Immediate Anesthesia Transfer of Care Note  Patient: Jerome ReevesMichael Revoir  Procedure(s) Performed: COLONOSCOPY WITH PROPOFOL (N/A )  Patient Location: PACU and Endoscopy Unit  Anesthesia Type:General  Level of Consciousness: drowsy  Airway & Oxygen Therapy: Patient Spontanous Breathing and Patient connected to nasal cannula oxygen  Post-op Assessment: Report given to RN and Post -op Vital signs reviewed and stable  Post vital signs: Reviewed and stable  Last Vitals:  Vitals Value Taken Time  BP    Temp    Pulse    Resp    SpO2      Last Pain:  Vitals:   03/12/18 0723  TempSrc: Tympanic  PainSc: 0-No pain         Complications: No apparent anesthesia complications

## 2018-03-12 NOTE — Op Note (Signed)
[facility] Gastroenterology Patient Name: Jerome Cunningham Procedure Date: 03/12/2018 7:19 AM MRN: [id number] Account #: [id number] Date of Birth: 1956/05/07 Admit Type: Outpatient Age: 62 Room: [facility] ENDO ROOM 4 Gender: Male Note Status: Finalized Procedure:            Colonoscopy Indications:          follow up diverticulitis, change in bowel habits Providers:            Boykin Nearing. Norma Fredrickson MD, MD Referring MD:         Marisue Ivan (Referring MD) Medicines:            Propofol per Anesthesia Complications:        No immediate complications. Procedure:            Pre-Anesthesia Assessment:                       - The risks and benefits of the procedure and the                        sedation options and risks were discussed with the                        patient. All questions were answered and informed                        consent was obtained.                       - Patient identification and proposed procedure were                        verified prior to the procedure by the nurse. The                        procedure was verified in the procedure room.                       - ASA Grade Assessment: III - A patient with severe                        systemic disease.                       - After reviewing the risks and benefits, the patient                        was deemed in satisfactory condition to undergo the                        procedure.                       After obtaining informed consent, the colonoscope was                        passed under direct vision. Throughout the procedure,                        the patient's blood pressure, pulse, and oxygen  saturations were monitored continuously. The                        Colonoscope was introduced through the anus and                        advanced to the the cecum, identified by appendiceal                        orifice and ileocecal valve. The colonoscopy was                        performed without difficulty. The patient tolerated the                        procedure well. The quality of the bowel preparation                        was adequate. The ileocecal valve, appendiceal orifice,                        and rectum were photographed. Findings:      The perianal and digital rectal examinations were normal. Pertinent       negatives include normal sphincter tone, no palpable rectal lesions and       normal prostate (size, shape, and consistency).      Multiple small and large-mouthed diverticula were found in the sigmoid       colon and descending colon.      Non-bleeding internal hemorrhoids were found during retroflexion. The       hemorrhoids were Grade I (internal hemorrhoids that do not prolapse).      The exam was otherwise without abnormality. Impression:           - Diverticulosis in the sigmoid colon and in the                        descending colon.                       - Non-bleeding internal hemorrhoids.                       - The examination was otherwise normal.                       - No specimens collected. Recommendation:       - Patient has a contact number available for                        emergencies. The signs and symptoms of potential                        delayed complications were discussed with the patient.                        Return to normal activities tomorrow. Written discharge                        instructions were provided to the patient.                       -  Resume previous diet.                       - Continue present medications.                       - High fiber diet.                       - Repeat colonoscopy in 10 years for screening purposes.                       - The findings and recommendations were discussed with                        the patient and their family.                       - Return to GI office PRN. Procedure Code(s):    --- Professional ---                        [phone], Colonoscopy, flexible; diagnostic, including                        collection of specimen(s) by brushing or washing, when                        performed (separate procedure) Diagnosis Code(s):    --- Professional ---                       K57.30, Diverticulosis of large intestine without                        perforation or abscess without bleeding                       K64.0, First degree hemorrhoids CPT copyright 2017 American Medical Association. All rights reserved. The codes documented in this report are preliminary and upon coder review may  be revised to meet current compliance requirements. Stanton Kidneyeodoro K Penney Domanski MD, MD 03/12/2018 8:37:00 AM This report has been signed electronically. Number of Addenda: 0 Note Initiated On: 03/12/2018 7:19 AM Scope Withdrawal Time: 0 hours 5 minutes 32 seconds  Total Procedure Duration: 0 hours 10 minutes 16 seconds       [facility]

## 2018-03-12 NOTE — Anesthesia Post-op Follow-up Note (Signed)
Anesthesia QCDR form completed.        

## 2018-03-12 NOTE — Anesthesia Postprocedure Evaluation (Signed)
Anesthesia Post Note  Patient: Jerome Cunningham  Procedure(s) Performed: COLONOSCOPY WITH PROPOFOL (N/A )  Patient location during evaluation: Endoscopy Anesthesia Type: General Level of consciousness: awake and alert Pain management: pain level controlled Vital Signs Assessment: post-procedure vital signs reviewed and stable Respiratory status: spontaneous breathing, nonlabored ventilation, respiratory function stable and patient connected to nasal cannula oxygen Cardiovascular status: blood pressure returned to baseline and stable Postop Assessment: no apparent nausea or vomiting Anesthetic complications: no     Last Vitals:  Vitals:   03/12/18 0850 03/12/18 0900  BP: 104/71 113/71  Pulse: 80 72  Resp: 18 16  Temp:    SpO2: 97% 95%    Last Pain:  Vitals:   03/12/18 0830  TempSrc: Tympanic  PainSc:                  Lenard SimmerAndrew Tyrhonda Georgiades

## 2018-03-13 ENCOUNTER — Encounter: Payer: Self-pay | Admitting: Internal Medicine

## 2018-04-02 ENCOUNTER — Other Ambulatory Visit: Payer: Self-pay

## 2018-04-03 ENCOUNTER — Encounter: Payer: Self-pay | Admitting: Surgery

## 2018-04-03 ENCOUNTER — Ambulatory Visit (INDEPENDENT_AMBULATORY_CARE_PROVIDER_SITE_OTHER): Admitting: Surgery

## 2018-04-03 VITALS — BP 149/78 | HR 94 | Temp 97.5°F | Wt 367.0 lb

## 2018-04-03 DIAGNOSIS — K4090 Unilateral inguinal hernia, without obstruction or gangrene, not specified as recurrent: Secondary | ICD-10-CM | POA: Diagnosis not present

## 2018-04-03 DIAGNOSIS — G4733 Obstructive sleep apnea (adult) (pediatric): Secondary | ICD-10-CM | POA: Insufficient documentation

## 2018-04-03 DIAGNOSIS — Z6841 Body Mass Index (BMI) 40.0 and over, adult: Secondary | ICD-10-CM

## 2018-04-03 DIAGNOSIS — K5730 Diverticulosis of large intestine without perforation or abscess without bleeding: Secondary | ICD-10-CM | POA: Diagnosis not present

## 2018-04-03 DIAGNOSIS — Z9989 Dependence on other enabling machines and devices: Secondary | ICD-10-CM

## 2018-04-03 NOTE — Patient Instructions (Signed)
Please give us a call in case you have any questions or concerns.   Inguinal Hernia, Adult An inguinal hernia is when fat or the intestines push through the area where the leg meets the lower belly (groin) and make a rounded lump (bulge). This condition happens over time. There are three types of inguinal hernias. These types include:  Hernias that can be pushed back into the belly (are reducible).  Hernias that cannot be pushed back into the belly (are incarcerated).  Hernias that cannot be pushed back into the belly and lose their blood supply (get strangulated). This type needs emergency surgery.  Follow these instructions at home: Lifestyle  Drink enough fluid to keep your urine (pee) clear or pale yellow.  Eat plenty of fruits, vegetables, and whole grains. These have a lot of fiber. Talk with your doctor if you have questions.  Avoid lifting heavy objects.  Avoid standing for long periods of time.  Do not use tobacco products. These include cigarettes, chewing tobacco, or e-cigarettes. If you need help quitting, ask your doctor.  Try to stay at a healthy weight. General instructions  Do not try to force the hernia back in.  Watch your hernia for any changes in color or size. Let your doctor know if there are any changes.  Take over-the-counter and prescription medicines only as told by your doctor.  Keep all follow-up visits as told by your doctor. This is important. Contact a doctor if:  You have a fever.  You have new symptoms.  Your symptoms get worse. Get help right away if:  The area where the legs meets the lower belly has: ? Pain that gets worse suddenly. ? A bulge that gets bigger suddenly and does not go down. ? A bulge that turns red or purple. ? A bulge that is painful to the touch.  You are a man and your scrotum: ? Suddenly feels painful. ? Suddenly changes in size.  You feel sick to your stomach (nauseous) and this feeling does not go  away.  You throw up (vomit) and this keeps happening.  You feel your heart beating a lot more quickly than normal.  You cannot poop (have a bowel movement) or pass gas. This information is not intended to replace advice given to you by your health care provider. Make sure you discuss any questions you have with your health care provider. Document Released: 10/11/2006 Document Revised: 02/16/2016 Document Reviewed: 07/21/2014 Elsevier Interactive Patient Education  2018 Elsevier Inc.  

## 2018-04-03 NOTE — Progress Notes (Signed)
Surgical Clinic Progress/Follow-up Note   HPI:  62 y.o. Male presents to clinic for follow-up and discussion regarding recent colonoscopy. Though last seen 3/21, patient says he took a while to schedule his colonoscopy. Nonetheless, he states he has increased the amount of water he drinks and fiber in his diet and has noticed a significant improvement in his former constipation with occasional brief transient episodes of constipation that seems to last 1 or 2 days at most. Patient says he bought and has at home Colace, but he says he has not needed it since changing his diet. Patient also adds that his previously reported Left groin pain has completely resolved with no "bulge" appreciated. Patient otherwise denies any fever/chills, unintentional weight loss, CP, or SOB.  Review of Systems:  Constitutional: denies any other weight loss, fever, chills, or sweats  Eyes: denies any other vision changes, history of eye injury  ENT: denies sore throat, hearing problems  Respiratory: denies shortness of breath, wheezing  Cardiovascular: denies chest pain, palpitations  Gastrointestinal: abdominal pain, N/V, and bowel function as per HPI Musculoskeletal: denies any other joint pains or cramps  Skin: Denies any other rashes or skin discolorations  Neurological: denies any other headache, dizziness, weakness  Psychiatric: denies any other depression, anxiety  All other review of systems: otherwise negative   Vital Signs:  BP (!) 149/78   Pulse 94   Temp (!) 97.5 F (36.4 C) (Oral)   Wt (!) 367 lb (166.5 kg)   BMI 48.42 kg/m   Physical Exam:  Constitutional:  -- Obese body habitus  -- Awake, alert, and oriented x3  Eyes:  -- Pupils equally round and reactive to light  -- No scleral icterus  Ear, nose, throat:  -- No jugular venous distension  -- No nasal drainage, bleeding Pulmonary:  -- No crackles -- Equal breath sounds bilaterally -- Breathing non-labored at rest Cardiovascular:   -- S1, S2 present  -- No pericardial rubs  Gastrointestinal:  -- Soft, nontender, non-distended, no guarding/rebound  -- No abdominal masses appreciated, pulsatile or otherwise  Musculoskeletal / Integumentary:  -- Wounds or skin discoloration: None appreciated  -- Extremities: B/L UE and LE FROM, hands and feet warm  Neurologic:  -- Motor function: intact and symmetric  -- Sensation: intact and symmetric   Imaging:  Colonoscopy [facility]([place], 03/12/2018) - Diverticulosis in the sigmoid colon and in the descending colon. - Non-bleeding internal hemorrhoids. - The examination was otherwise normal.   Assessment:  62 y.o. yo Male with a problem list including...  Patient Active Problem List   Diagnosis Date Noted  . OSA on CPAP 04/03/2018  . Diabetes mellitus without complication (HCC) 12/09/2017  . Hypertension, essential 12/09/2017  . Low testosterone 12/09/2017  . Morbid obesity due to excess calories (HCC) 12/09/2017  . Osteoarthritis 12/09/2017  . Tobacco dependence 12/09/2017  . Hydrocele 11/26/2017  . History of normocytic normochromic anemia 04/19/2017  . Primary osteoarthritis of right hip 02/14/2016  . Vaccine counseling 11/15/2014    presents to clinic for follow-up evaluation and management of diverticulitis, constipation, and Left inguinal hernia, doing overall well.  Plan:              - ensure adequate hydration and high-fiber diet + once daily Colace stool softener (hold if loose BM's)             - also discussed partial colectomy and indications for elective vs emergent surgery  - return to clinic as needed, instructed to call office  if any questions or concerns  All of the above recommendations were discussed with the patient, and all of patient's questions were answered to his expressed satisfaction.  -- Scherrie Gerlach Earlene Plater, MD, RPVI [place]: [facility] General Surgery - Partnering for exceptional care. Office: [phone]

## 2018-04-04 ENCOUNTER — Encounter: Payer: Self-pay | Admitting: Surgery

## 2018-04-16 ENCOUNTER — Other Ambulatory Visit: Payer: Self-pay | Admitting: Specialist

## 2018-04-16 DIAGNOSIS — R0602 Shortness of breath: Secondary | ICD-10-CM

## 2018-05-01 ENCOUNTER — Telehealth: Payer: Self-pay | Admitting: *Deleted

## 2018-05-01 NOTE — Telephone Encounter (Signed)
Patient is scheduled for lung screening scan. contacted patient and obtained smoking history,(current, 40 pack year) as well as answering questions related to screening process. Patient denies signs of lung cancer such as weight loss or hemoptysis. Patient denies comorbidity that would prevent curative treatment if lung cancer were found. Patient is scheduled for shared decision making visit and CT scan on 05/01/18.

## 2018-05-02 ENCOUNTER — Ambulatory Visit: Admission: RE | Admit: 2018-05-02 | Source: Ambulatory Visit

## 2018-05-02 ENCOUNTER — Ambulatory Visit
Admission: RE | Admit: 2018-05-02 | Discharge: 2018-05-02 | Disposition: A | Discharge status: Home / Self Care | Source: Ambulatory Visit | Attending: Specialist | Admitting: Specialist

## 2018-05-02 ENCOUNTER — Ambulatory Visit

## 2018-05-02 ENCOUNTER — Inpatient Hospital Stay: Attending: Oncology | Admitting: Oncology

## 2018-05-02 DIAGNOSIS — F17200 Nicotine dependence, unspecified, uncomplicated: Secondary | ICD-10-CM | POA: Diagnosis not present

## 2018-05-02 DIAGNOSIS — J449 Chronic obstructive pulmonary disease, unspecified: Secondary | ICD-10-CM | POA: Diagnosis not present

## 2018-05-02 DIAGNOSIS — R0602 Shortness of breath: Secondary | ICD-10-CM | POA: Diagnosis present

## 2018-05-02 DIAGNOSIS — Z87891 Personal history of nicotine dependence: Secondary | ICD-10-CM

## 2018-05-02 NOTE — Progress Notes (Signed)
In accordance with CMS guidelines, patient has met eligibility criteria including age, absence of signs or symptoms of lung cancer.  Social History   Tobacco Use  . Smoking status: Current Every Day Smoker    Packs/day: 1.00    Years: 40.00    Pack years: 40.00  . Smokeless tobacco: Never Used  Substance Use Topics  . Alcohol use: No  . Drug use: No     A shared decision-making session was conducted prior to the performance of CT scan. This includes one or more decision aids, includes benefits and harms of screening, follow-up diagnostic testing, over-diagnosis, false positive rate, and total radiation exposure.  Counseling on the importance of adherence to annual lung cancer LDCT screening, impact of co-morbidities, and ability or willingness to undergo diagnosis and treatment is imperative for compliance of the program.  Counseling on the importance of continued smoking cessation for former smokers; the importance of smoking cessation for current smokers, and information about tobacco cessation interventions have been given to patient including [place] and 1800 quit [place] programs.  Written order for lung cancer screening with LDCT has been given to the patient and any and all questions have been answered to the best of my abilities.   Yearly follow up will be coordinated by Burgess Estelle, Thoracic Navigator.  Faythe Casa, NP 05/02/2018 8:54 AM

## 2018-05-05 ENCOUNTER — Encounter: Payer: Self-pay | Admitting: *Deleted

## 2018-12-15 ENCOUNTER — Ambulatory Visit: Admitting: Urology

## 2018-12-16 ENCOUNTER — Ambulatory Visit: Admitting: Urology

## 2018-12-29 ENCOUNTER — Ambulatory Visit: Admitting: Urology

## 2019-01-01 ENCOUNTER — Telehealth (INDEPENDENT_AMBULATORY_CARE_PROVIDER_SITE_OTHER): Admitting: Urology

## 2019-01-01 ENCOUNTER — Other Ambulatory Visit: Payer: Self-pay

## 2019-01-01 DIAGNOSIS — N433 Hydrocele, unspecified: Secondary | ICD-10-CM | POA: Diagnosis not present

## 2019-01-01 NOTE — Progress Notes (Signed)
Virtual Visit via Telephone Note  I connected with Jerome Cunningham on 01/01/19 at  9:00 AM EDT by telephone and verified that I am speaking with the correct person using two identifiers.   I discussed the limitations, risks, security and privacy concerns of performing an evaluation and management service by telephone and the availability of in person appointments. We discussed the impact of the COVID-19 on the healthcare system, and the importance of social distancing and reducing patient and provider exposure. I also discussed with the patient that there may be a patient responsible charge related to this service. The patient expressed understanding and agreed to proceed.  Reason for visit: Discuss hydrocele options  History of Present Illness: 63 year old male seen March 2019 for a moderate to large left hydrocele.  The hydrocele was causing penile retraction.  He is interested in pursuing hydrocele treatment and wanted to rediscuss options that were discussed last year.  He thinks the size has increased slightly.  He complains of nuisance symptoms due to penile retraction with subsequent voiding issues.  Assessment and Plan: I discussed options of hydrocele aspiration with or without sclerotherapy and hydrocelectomy.  We did discuss the high recurrence rate of aspiration only and potential problems with sclerotherapy.  He would like to schedule hydrocelectomy sometime this summer.  Follow Up: Once we are clear from COVID-19 and are able to do elective cases the patient will be contacted for scheduling and a preop appointment.   I discussed the assessment and treatment plan with the patient. The patient was provided an opportunity to ask questions and all were answered. The patient agreed with the plan and demonstrated an understanding of the instructions.   The patient was advised to call back or seek an in-person evaluation if the symptoms worsen or if the condition fails to improve as  anticipated.  I provided 11 minutes of non-face-to-face time during this encounter.   Riki Altes, MD

## 2019-03-02 ENCOUNTER — Telehealth: Payer: Self-pay | Admitting: Urology

## 2019-03-02 ENCOUNTER — Other Ambulatory Visit: Payer: Self-pay | Admitting: Radiology

## 2019-03-02 DIAGNOSIS — N433 Hydrocele, unspecified: Secondary | ICD-10-CM

## 2019-03-02 NOTE — Telephone Encounter (Signed)
Patient was given the [facility] Information form below as well as the Instructions for Pre-Admission Testing form & a map of [facility] [place].   [address], [address], [place] [address] Telephone: [phone] Fax: [phone]   Thank you for choosing [facility] for your upcoming surgery!  We are always here to assist in your urological needs.  Please read the following information with specific details for your upcoming appointments related to your surgery. Please contact Amy at [phone] Option 3 with any questions.  The Name of Your Surgery: Hydrocelectomy Your Surgery Date: 04/08/2019 Your Surgeon: John Giovanni  Please call Same Day Surgery at [phone] between the hours of 1pm-3pm one day prior to your surgery. They will inform you of the time to arrive at Same Day Surgery which is located on the second floor of the [facility].   Please refer to the attached letter regarding instructions for Pre-Admission Testing. You will receive a call from the [place] office regarding your appointment with them.  The Pre-Admission Testing office is located at [place], on the first floor of the [place] at [facility] in [place] (office is to the right as you enter through the [employer] of the [facility]). Please have all medications you are currently taking and your insurance card available.   Patient was advised to have nothing to eat or drink after midnight the night prior to surgery except that he may have only water until 2 hours before surgery with nothing to drink within 2 hours of surgery.  The patient states he currently takes ASA 81mg  & was informed to hold medication for 7 days prior to surgery beginning on 04/01/2019. Patient's questions were answered and he expressed understanding of these instructions.

## 2019-03-11 ENCOUNTER — Other Ambulatory Visit: Payer: Self-pay | Admitting: Radiology

## 2019-04-01 ENCOUNTER — Other Ambulatory Visit

## 2019-04-03 ENCOUNTER — Other Ambulatory Visit: Payer: Self-pay

## 2019-04-03 ENCOUNTER — Encounter
Admission: RE | Admit: 2019-04-03 | Discharge: 2019-04-03 | Disposition: A | Discharge status: Home / Self Care | Source: Ambulatory Visit | Attending: Urology | Admitting: Urology

## 2019-04-03 DIAGNOSIS — Z1159 Encounter for screening for other viral diseases: Secondary | ICD-10-CM | POA: Insufficient documentation

## 2019-04-03 DIAGNOSIS — E119 Type 2 diabetes mellitus without complications: Secondary | ICD-10-CM | POA: Diagnosis not present

## 2019-04-03 DIAGNOSIS — Z01818 Encounter for other preprocedural examination: Secondary | ICD-10-CM | POA: Diagnosis not present

## 2019-04-03 LAB — CBC
HCT: 43 % (ref 39.0–52.0)
Hemoglobin: 14.1 g/dL (ref 13.0–17.0)
MCH: 28.4 pg (ref 26.0–34.0)
MCHC: 32.8 g/dL (ref 30.0–36.0)
MCV: 86.5 fL (ref 80.0–100.0)
Platelets: 296 10*3/uL (ref 150–400)
RBC: 4.97 MIL/uL (ref 4.22–5.81)
RDW: 14 % (ref 11.5–15.5)
WBC: 9.5 10*3/uL (ref 4.0–10.5)
nRBC: 0 % (ref 0.0–0.2)

## 2019-04-03 LAB — SARS CORONAVIRUS 2 (TAT 6-24 HRS): SARS Coronavirus 2: NEGATIVE

## 2019-04-03 NOTE — Patient Instructions (Addendum)
Your procedure is scheduled on: 04/08/2019 Wed Report to Same Day Surgery 2nd floor medical mall [facility] Entrance-take elevator on left to 2nd floor.  Check in with surgery information desk.) To find out your arrival time please call [phone] between 1PM - 3PM on 04/07/2019 Tues  Remember: Instructions that are not followed completely may result in serious medical risk, up to and including death, or upon the discretion of your surgeon and anesthesiologist your surgery may need to be rescheduled.    _x___ 1. Do not eat food after midnight the night before your procedure. You may drink clear liquids up to 2 hours before you are scheduled to arrive at the hospital for your procedure.  Do not drink clear liquids within 2 hours of your scheduled arrival to the hospital.  Clear liquids include  --Water or Apple juice without pulp  --Clear carbohydrate beverage such as ClearFast or Gatorade  --Black Coffee or Clear Tea (No milk, no creamers, do not add anything to                  the coffee or Tea Type 1 and type 2 diabetics should only drink water.   ____Ensure clear carbohydrate drink on the way to the hospital for bariatric patients  ____Ensure clear carbohydrate drink 3 hours before surgery for Dr Dwyane Luo patients if physician instructed.   No gum chewing or hard candies.     __x__ 2. No Alcohol for 24 hours before or after surgery.   __x__3. No Smoking or e-cigarettes for 24 prior to surgery.  Do not use any chewable tobacco products for at least 6 hour prior to surgery   ____  4. Bring all medications with you on the day of surgery if instructed.    __x__ 5. Notify your doctor if there is any change in your medical condition     (cold, fever, infections).    x___6. On the morning of surgery brush your teeth with toothpaste and water.  You may rinse your mouth with mouth wash if you wish.  Do not swallow any toothpaste or mouthwash.   Do not wear jewelry, make-up,  hairpins, clips or nail polish.  Do not wear lotions, powders, or perfumes. You may wear deodorant.  Do not shave 48 hours prior to surgery. Men may shave face and neck.  Do not bring valuables to the hospital.    [facility] is not responsible for any belongings or valuables.               Contacts, dentures or bridgework may not be worn into surgery.  Leave your suitcase in the car. After surgery it may be brought to your room.  For patients admitted to the hospital, discharge time is determined by your                       treatment team.  _  Patients discharged the day of surgery will not be allowed to drive home.  You will need someone to drive you home and stay with you the night of your procedure.    Please read over the following fact sheets that you were given:   [facility] Preparing for Surgery and or MRSA Information   _x___ Take anti-hypertensive listed below, cardiac, seizure, asthma,     anti-reflux and psychiatric medicines. These include:  1. None  2.  3.  4.  5.  6.  ____Fleets enema or Magnesium Citrate as directed.  _x___ Use CHG Soap or sage wipes as directed on instruction sheet   ____ Use inhalers on the day of surgery and bring to hospital day of surgery  __x__ Stop Metformin and Janumet 2 days prior to surgery.    ____ Take 1/2 of usual insulin dose the night before surgery and none on the morning     surgery.   _x___ Follow recommendations from Cardiologist, Pulmonologist or PCP regarding          stopping Aspirin, Coumadin, Plavix ,Eliquis, Effient, or Pradaxa, and Pletal.  X____Stop Anti-inflammatories such as Advil, Aleve, Ibuprofen, Motrin, Naproxen, Naprosyn, Goodies powders or aspirin products. OK to take Tylenol and                          Celebrex.   _x___ Stop supplements until after surgery.  But may continue Vitamin D, Vitamin B,       and multivitamin.   ____ Bring C-Pap to the hospital.  x

## 2019-04-08 ENCOUNTER — Ambulatory Visit: Admitting: Certified Registered Nurse Anesthetist

## 2019-04-08 ENCOUNTER — Ambulatory Visit
Admission: RE | Admit: 2019-04-08 | Discharge: 2019-04-08 | Disposition: A | Discharge status: Home / Self Care | Attending: Urology | Admitting: Urology

## 2019-04-08 ENCOUNTER — Telehealth: Payer: Self-pay | Admitting: Urology

## 2019-04-08 ENCOUNTER — Encounter: Payer: Self-pay | Admitting: *Deleted

## 2019-04-08 ENCOUNTER — Other Ambulatory Visit: Payer: Self-pay

## 2019-04-08 ENCOUNTER — Encounter
Admission: RE | Disposition: A | Discharge status: Home / Self Care | Payer: Self-pay | Source: Home / Self Care | Attending: Urology

## 2019-04-08 DIAGNOSIS — F17210 Nicotine dependence, cigarettes, uncomplicated: Secondary | ICD-10-CM | POA: Diagnosis not present

## 2019-04-08 DIAGNOSIS — Z7982 Long term (current) use of aspirin: Secondary | ICD-10-CM | POA: Diagnosis not present

## 2019-04-08 DIAGNOSIS — G4730 Sleep apnea, unspecified: Secondary | ICD-10-CM | POA: Diagnosis not present

## 2019-04-08 DIAGNOSIS — N433 Hydrocele, unspecified: Secondary | ICD-10-CM | POA: Diagnosis not present

## 2019-04-08 DIAGNOSIS — Z96641 Presence of right artificial hip joint: Secondary | ICD-10-CM | POA: Insufficient documentation

## 2019-04-08 DIAGNOSIS — I10 Essential (primary) hypertension: Secondary | ICD-10-CM | POA: Diagnosis not present

## 2019-04-08 DIAGNOSIS — E119 Type 2 diabetes mellitus without complications: Secondary | ICD-10-CM | POA: Diagnosis not present

## 2019-04-08 DIAGNOSIS — N430 Encysted hydrocele: Secondary | ICD-10-CM

## 2019-04-08 DIAGNOSIS — Z79899 Other long term (current) drug therapy: Secondary | ICD-10-CM | POA: Diagnosis not present

## 2019-04-08 DIAGNOSIS — M1990 Unspecified osteoarthritis, unspecified site: Secondary | ICD-10-CM | POA: Insufficient documentation

## 2019-04-08 HISTORY — PX: HYDROCELE EXCISION: SHX482

## 2019-04-08 LAB — GLUCOSE, CAPILLARY
Glucose-Capillary: 127 mg/dL — ABNORMAL HIGH (ref 70–99)
Glucose-Capillary: 105 mg/dL — ABNORMAL HIGH (ref 70–99)

## 2019-04-08 SURGERY — HYDROCELECTOMY
Anesthesia: General | Laterality: Left

## 2019-04-08 MED ORDER — LIDOCAINE HCL (CARDIAC) PF 100 MG/5ML IV SOSY
PREFILLED_SYRINGE | INTRAVENOUS | Status: DC | PRN
  Administered 2019-04-08: 80 mg via INTRAVENOUS

## 2019-04-08 MED ORDER — FENTANYL CITRATE (PF) 100 MCG/2ML IJ SOLN
INTRAMUSCULAR | Status: DC | PRN
  Administered 2019-04-08 (×2): 50 ug via INTRAVENOUS
  Administered 2019-04-08: 100 ug via INTRAVENOUS

## 2019-04-08 MED ORDER — DOUBLE ANTIBIOTIC 500-10000 UNIT/GM EX OINT
TOPICAL_OINTMENT | CUTANEOUS | Status: DC | PRN
  Administered 2019-04-08: 1 via TOPICAL

## 2019-04-08 MED ORDER — SULFAMETHOXAZOLE-TRIMETHOPRIM 800-160 MG PO TABS: 1.0000 | ORAL_TABLET | Freq: Two times a day (BID) | ORAL | 0 refills | Status: AC

## 2019-04-08 MED ORDER — FAMOTIDINE 20 MG PO TABS
20.0000 mg | ORAL_TABLET | Freq: Once | ORAL | Status: AC
  Administered 2019-04-08: 11:00:00 20 mg via ORAL

## 2019-04-08 MED ORDER — FENTANYL CITRATE (PF) 100 MCG/2ML IJ SOLN
INTRAMUSCULAR | Status: AC
  Filled 2019-04-08: qty 2

## 2019-04-08 MED ORDER — FENTANYL CITRATE (PF) 100 MCG/2ML IJ SOLN: 25.0000 ug | INTRAMUSCULAR | Status: DC | PRN

## 2019-04-08 MED ORDER — CEFAZOLIN SODIUM-DEXTROSE 1-4 GM/50ML-% IV SOLN
INTRAVENOUS | Status: DC | PRN
  Administered 2019-04-08: 1 g via INTRAVENOUS

## 2019-04-08 MED ORDER — GLYCOPYRROLATE 0.2 MG/ML IJ SOLN
INTRAMUSCULAR | Status: AC
  Filled 2019-04-08: qty 1

## 2019-04-08 MED ORDER — CEFAZOLIN SODIUM-DEXTROSE 2-4 GM/100ML-% IV SOLN
2.0000 g | INTRAVENOUS | Status: AC
  Administered 2019-04-08: 2 g via INTRAVENOUS

## 2019-04-08 MED ORDER — ONDANSETRON HCL 4 MG/2ML IJ SOLN
INTRAMUSCULAR | Status: AC
  Filled 2019-04-08: qty 2

## 2019-04-08 MED ORDER — SUCCINYLCHOLINE CHLORIDE 20 MG/ML IJ SOLN
INTRAMUSCULAR | Status: DC | PRN
  Administered 2019-04-08: 140 mg via INTRAVENOUS

## 2019-04-08 MED ORDER — ONDANSETRON HCL 4 MG/2ML IJ SOLN
INTRAMUSCULAR | Status: DC | PRN
  Administered 2019-04-08: 4 mg via INTRAVENOUS

## 2019-04-08 MED ORDER — FAMOTIDINE 20 MG PO TABS
ORAL_TABLET | ORAL | Status: AC
  Administered 2019-04-08: 11:00:00 20 mg via ORAL
  Filled 2019-04-08: qty 1

## 2019-04-08 MED ORDER — SUGAMMADEX SODIUM 500 MG/5ML IV SOLN
INTRAVENOUS | Status: AC
  Filled 2019-04-08: qty 5

## 2019-04-08 MED ORDER — PHENYLEPHRINE HCL (PRESSORS) 10 MG/ML IV SOLN
INTRAVENOUS | Status: DC | PRN
  Administered 2019-04-08 (×3): 100 ug via INTRAVENOUS

## 2019-04-08 MED ORDER — PROPOFOL 10 MG/ML IV BOLUS
INTRAVENOUS | Status: AC
  Filled 2019-04-08: qty 20

## 2019-04-08 MED ORDER — BACITRACIN ZINC 500 UNIT/GM EX OINT
TOPICAL_OINTMENT | CUTANEOUS | Status: AC
  Filled 2019-04-08: qty 28.35

## 2019-04-08 MED ORDER — SUGAMMADEX SODIUM 500 MG/5ML IV SOLN
INTRAVENOUS | Status: DC | PRN
  Administered 2019-04-08: 340 mg via INTRAVENOUS

## 2019-04-08 MED ORDER — HYDROCODONE-ACETAMINOPHEN 5-325 MG PO TABS: 1.0000 | ORAL_TABLET | ORAL | 0 refills | Status: DC | PRN

## 2019-04-08 MED ORDER — ONDANSETRON HCL 4 MG/2ML IJ SOLN: 4.0000 mg | Freq: Once | INTRAMUSCULAR | Status: DC | PRN

## 2019-04-08 MED ORDER — CEFAZOLIN SODIUM-DEXTROSE 2-4 GM/100ML-% IV SOLN
INTRAVENOUS | Status: AC
  Filled 2019-04-08: qty 100

## 2019-04-08 MED ORDER — BUPIVACAINE HCL 0.5 % IJ SOLN
INTRAMUSCULAR | Status: DC | PRN
  Administered 2019-04-08: 4 mL

## 2019-04-08 MED ORDER — SODIUM CHLORIDE 0.9 % IV SOLN
INTRAVENOUS | Status: DC
  Administered 2019-04-08 (×2): via INTRAVENOUS

## 2019-04-08 MED ORDER — ROCURONIUM BROMIDE 100 MG/10ML IV SOLN
INTRAVENOUS | Status: DC | PRN
  Administered 2019-04-08: 20 mg via INTRAVENOUS
  Administered 2019-04-08: 30 mg via INTRAVENOUS

## 2019-04-08 MED ORDER — CEFAZOLIN SODIUM 1 G IJ SOLR
INTRAMUSCULAR | Status: AC
  Filled 2019-04-08: qty 10

## 2019-04-08 MED ORDER — BUPIVACAINE HCL (PF) 0.5 % IJ SOLN
INTRAMUSCULAR | Status: AC
  Filled 2019-04-08: qty 30

## 2019-04-08 MED ORDER — GLYCOPYRROLATE 0.2 MG/ML IJ SOLN
INTRAMUSCULAR | Status: DC | PRN
  Administered 2019-04-08: 0.2 mg via INTRAVENOUS

## 2019-04-08 MED ORDER — DEXMEDETOMIDINE HCL 200 MCG/2ML IV SOLN
INTRAVENOUS | Status: DC | PRN
  Administered 2019-04-08: 40 ug via INTRAVENOUS

## 2019-04-08 MED ORDER — PROPOFOL 10 MG/ML IV BOLUS
INTRAVENOUS | Status: DC | PRN
  Administered 2019-04-08: 200 mg via INTRAVENOUS

## 2019-04-08 SURGICAL SUPPLY — 34 items
APL PRP STRL LF DISP 70% ISPRP (MISCELLANEOUS) ×1
BLADE CLIPPER SURG (BLADE) ×2 IMPLANT
BLADE SURG 15 STRL LF DISP TIS (BLADE) ×1 IMPLANT
BLADE SURG 15 STRL SS (BLADE) ×2
CANISTER SUCT 1200ML W/VALVE (MISCELLANEOUS) ×2 IMPLANT
CHLORAPREP W/TINT 26 (MISCELLANEOUS) ×2 IMPLANT
COVER WAND RF STERILE (DRAPES) ×2 IMPLANT
DRAIN PENROSE 1/4X12 LTX (DRAIN) ×2 IMPLANT
DRAPE LAPAROTOMY 77X122 PED (DRAPES) ×2 IMPLANT
DRSG GAUZE FLUFF 36X18 (GAUZE/BANDAGES/DRESSINGS) ×2 IMPLANT
ELECT CAUTERY BLADE 6.4 (BLADE) ×2 IMPLANT
ELECT REM PT RETURN 9FT ADLT (ELECTROSURGICAL) ×2
ELECTRODE REM PT RTRN 9FT ADLT (ELECTROSURGICAL) ×1 IMPLANT
GAUZE SPONGE 4X4 12PLY STRL (GAUZE/BANDAGES/DRESSINGS) ×1 IMPLANT
GLOVE BIO SURGEON STRL SZ8 (GLOVE) ×3 IMPLANT
GOWN STRL REUS W/ TWL LRG LVL3 (GOWN DISPOSABLE) ×1 IMPLANT
GOWN STRL REUS W/TWL LRG LVL3 (GOWN DISPOSABLE) ×2
GOWN STRL REUS W/TWL XL LVL4 (GOWN DISPOSABLE) ×2 IMPLANT
KIT TURNOVER KIT A (KITS) ×2 IMPLANT
LABEL OR SOLS (LABEL) ×1 IMPLANT
NDL HYPO 25X1 1.5 SAFETY (NEEDLE) ×1 IMPLANT
NEEDLE HYPO 25X1 1.5 SAFETY (NEEDLE) ×2 IMPLANT
NS IRRIG 500ML POUR BTL (IV SOLUTION) ×2 IMPLANT
PACK BASIN MINOR ARMC (MISCELLANEOUS) ×2 IMPLANT
SUPPORETR ATHLETIC LG (MISCELLANEOUS) ×1 IMPLANT
SUPPORTER ATHLETIC LG (MISCELLANEOUS) ×2
SUT CHROMIC 3 0 PS 2 (SUTURE) ×1 IMPLANT
SUT CHROMIC 3 0 SH 27 (SUTURE) ×2 IMPLANT
SUT ETHILON 3-0 FS-10 30 BLK (SUTURE) ×2
SUT ETHILON NAB PS2 4-0 18IN (SUTURE) ×2 IMPLANT
SUT MNCRL 3-0 UNDYED SH (SUTURE) ×1 IMPLANT
SUT MONOCRYL 3-0 UNDYED (SUTURE) ×1
SUTURE EHLN 3-0 FS-10 30 BLK (SUTURE) ×1 IMPLANT
SYR 10ML LL (SYRINGE) ×2 IMPLANT

## 2019-04-08 NOTE — Op Note (Signed)
Preoperative diagnosis:  1. Left hydrocele  Postoperative diagnosis:  1. Left hydrocele  Procedure: 1. Left hydrocelectomy  Surgeon: Abbie Sons, MD  Anesthesia: General  Complications: None  Intraoperative findings: Moderate left hydrocele  EBL: Minimal  Specimens: None  Indication: Jerome Cunningham is a 63 y.o. patient with a symptomatic left hydrocele.  After reviewing the management options for treatment, he elected to proceed with the above surgical procedure(s). We have discussed the potential benefits and risks of the procedure, side effects of the proposed treatment, the likelihood of the patient achieving the goals of the procedure, and any potential problems that might occur during the procedure or recuperation. Informed consent has been obtained.  Description of procedure:  The patient was taken to the operating room and general anesthesia was induced.  The patient was placed in the dorsal lithotomy position, prepped and draped in the usual sterile fashion, and preoperative antibiotics were administered. A preoperative time-out was performed.   A transverse left mid hemiscrotal was made and carried through the dartos fascia with cautery.  The hydrocele sac was exposed and bluntly separated from the scrotal wall and delivered into the operative field.  The hydrocele sac was incised anteriorly and approximately 200 mL of straw-colored fluid was aspirated.  The sac was then excised with cautery leaving an approximately 2 cm rim around the testis and cord structures.  The excised sac edge was then whipstitched with a running 3-0 chromic suture.  The testis was normal in appearance and there was no evidence of a communicating hydrocele.  Hemostasis was noted to be adequate and the testis was delivered back into the left hemiscrotum in its anatomic position.  1/4 inch Penrose drain was placed via a separate stab incision in the dependent portion of the left hemiscrotum and  secured with 3-0 nylon.  The dartos was closed with a running 3-0 Monocryl suture and the skin was closed with a running horizontal mattress suture of 3-0 Monocryl.  Prior to closure the skin was anesthetized with half percent plain Sensorcaine.  At the completion procedure sponge and needle counts were correct. A dressing of bacitracin ointment, fluffs and scrotal support was applied.  After anesthetic reversal patient was transported to the PACU in stable condition.   Abbie Sons, M.D.

## 2019-04-08 NOTE — Anesthesia Procedure Notes (Signed)
Procedure Name: Intubation Date/Time: 04/08/2019 3:23 PM Performed by: Jonna Clark, CRNA Pre-anesthesia Checklist: Patient identified, Patient being monitored, Timeout performed, Emergency Drugs available and Suction available Patient Re-evaluated:Patient Re-evaluated prior to induction Oxygen Delivery Method: Circle system utilized Preoxygenation: Pre-oxygenation with 100% oxygen Induction Type: IV induction Ventilation: Two handed mask ventilation required Laryngoscope Size: McGraph and 4 Grade View: Grade II Tube type: Oral Tube size: 7.5 mm Number of attempts: 1 Airway Equipment and Method: Stylet Placement Confirmation: ETT inserted through vocal cords under direct vision,  positive ETCO2 and breath sounds checked- equal and bilateral Secured at: 22 cm Tube secured with: Tape Dental Injury: Teeth and Oropharynx as per pre-operative assessment  Future Recommendations: Recommend- induction with short-acting agent, and alternative techniques readily available

## 2019-04-08 NOTE — Anesthesia Postprocedure Evaluation (Signed)
Anesthesia Post Note  Patient: Jerome Cunningham  Procedure(s) Performed: HYDROCELECTOMY ADULT (Left )  Patient location during evaluation: PACU Anesthesia Type: General Level of consciousness: awake and alert and oriented Pain management: pain level controlled Vital Signs Assessment: post-procedure vital signs reviewed and stable Respiratory status: spontaneous breathing Cardiovascular status: blood pressure returned to baseline Anesthetic complications: no     Last Vitals:  Vitals:   04/08/19 1737 04/08/19 1813  BP: (!) 152/94 (!) 142/83  Pulse: 81 83  Resp: 18 18  Temp: (!) 36.1 C   SpO2: 95% 100%    Last Pain:  Vitals:   04/08/19 1813  TempSrc:   PainSc: 0-No pain                 Dominica Kent

## 2019-04-08 NOTE — Transfer of Care (Signed)
Immediate Anesthesia Transfer of Care Note  Patient: Dewarren Ledbetter  Procedure(s) Performed: HYDROCELECTOMY ADULT (Left )  Patient Location: PACU  Anesthesia Type:General  Level of Consciousness: awake, alert  and oriented  Airway & Oxygen Therapy: Patient Spontanous Breathing and Patient connected to face mask oxygen  Post-op Assessment: Report given to RN and Post -op Vital signs reviewed and stable  Post vital signs: Reviewed and stable  Last Vitals:  Vitals Value Taken Time  BP 130/78 04/08/19 1625  Temp    Pulse 89 04/08/19 1633  Resp 15 04/08/19 1633  SpO2 92 % 04/08/19 1633  Vitals shown include unvalidated device data.  Last Pain:  Vitals:   04/08/19 1625  TempSrc:   PainSc: 0-No pain         Complications: No apparent anesthesia complications

## 2019-04-08 NOTE — Discharge Instructions (Signed)
Discharge instructions following scrotal surgery  Call your doctor for:  Fever is greater than 100.5  Severe nausea or vomiting  Increasing pain not controlled by pain medication  Increasing redness or drainage from incisions  The number for questions or concerns is [phone]  Medications: You may resume your regular medications.  Prescriptions for pain medication and an antibiotic were sent to your pharmacy.  Activity level: No lifting greater than 10 pounds (about equal to gallon of milk) for the next 2 weeks or until cleared to do so at follow-up appointment.  Otherwise activity as tolerated by comfort level.  Diet: May resume your regular diet as tolerated  Driving: No driving while still taking opiate pain medications (weight at least 6-8 hours after last dose).  No driving if you still sore from surgery as it may limit her ability to react quickly if necessary.   Shower/bath: May shower 48 hours after surgery.  No tub bath, hot tub or swimming for 10 days.  Wound care: He may cover wounds with sterile gauze as needed to prevent incisions rubbing on close follow-up in any seepage.  Where tight fitting underpants for at least 2 weeks.  He should apply cold compresses (ice or sac of frozen peas/corn) to your scrotum for at least 48 hours to reduce the swelling.  You should expect that his scrotum will swell up initially and then get smaller over the next 2-4 weeks.  You do have a drain in place and you can replace the gauze as needed.  You will be notified with an appointment on 7/17 to have the drain removed.  AMBULATORY SURGERY  DISCHARGE INSTRUCTIONS   1) The drugs that you were given will stay in your system until tomorrow so for the next 24 hours you should not:  A) Drive an automobile B) Make any legal decisions C) Drink any alcoholic beverage   2) You may resume regular meals tomorrow.  Today it is better to start with liquids and gradually work up to solid  foods.  You may eat anything you prefer, but it is better to start with liquids, then soup and crackers, and gradually work up to solid foods.   3) Please notify your doctor immediately if you have any unusual bleeding, trouble breathing, redness and pain at the surgery site, drainage, fever, or pain not relieved by medication.    4) Additional Instructions:        Please contact your physician with any problems or Same Day Surgery at [phone], Monday through Friday 6 am to 4 pm, or [place] at [facility] number at [phone].

## 2019-04-08 NOTE — Anesthesia Preprocedure Evaluation (Signed)
Anesthesia Evaluation  Patient identified by MRN, date of birth, ID band Patient awake    Reviewed: Allergy & Precautions, NPO status , Patient's Chart, lab work & pertinent test results, reviewed documented beta blocker date and time   Airway Mallampati: III  TM Distance: >3 FB     Dental  (+) Chipped   Pulmonary sleep apnea and Continuous Positive Airway Pressure Ventilation , Current Smoker,           Cardiovascular hypertension, Pt. on medications      Neuro/Psych    GI/Hepatic   Endo/Other  diabetes, Type 2Morbid obesity  Renal/GU      Musculoskeletal  (+) Arthritis ,   Abdominal   Peds  Hematology   Anesthesia Other Findings Smokes.  Reproductive/Obstetrics                             Anesthesia Physical Anesthesia Plan  ASA: III  Anesthesia Plan: General   Post-op Pain Management:    Induction: Intravenous  PONV Risk Score and Plan:   Airway Management Planned: LMA  Additional Equipment:   Intra-op Plan:   Post-operative Plan:   Informed Consent: I have reviewed the patients History and Physical, chart, labs and discussed the procedure including the risks, benefits and alternatives for the proposed anesthesia with the patient or authorized representative who has indicated his/her understanding and acceptance.       Plan Discussed with: CRNA  Anesthesia Plan Comments:         Anesthesia Quick Evaluation

## 2019-04-08 NOTE — Interval H&P Note (Signed)
History and Physical Interval Note:  04/08/2019 2:19 PM  Jerome Cunningham  has presented today for surgery, with the diagnosis of LEFT HYDROCELE.  The various methods of treatment have been discussed with the patient and family. After consideration of risks, benefits and other options for treatment, the patient has consented to  Procedure(s): HYDROCELECTOMY ADULT (Left) as a surgical intervention.  The patient's history has been reviewed, patient examined, no change in status, stable for surgery.  I have reviewed the patient's chart and labs.  Questions were answered to the patient's satisfaction.     [place]

## 2019-04-08 NOTE — Anesthesia Post-op Follow-up Note (Signed)
Anesthesia QCDR form completed.        

## 2019-04-08 NOTE — Telephone Encounter (Signed)
-----   Message from Abbie Sons, MD sent at 04/08/2019  4:26 PM EDT ----- Needs a nurse visit this Friday for drain removal

## 2019-04-08 NOTE — H&P (Signed)
   04/08/2019 12:33 PM   Jerome Cunningham 1956-09-02 [id number]  Referring provider: No referring provider defined for this encounter.  No chief complaint on file.   HPI: 63 year old male initially seen on 11/26/2017 with a left hydrocele present for several months.  He complained of mild discomfort and penile retraction due to the size.  Scrotal ultrasound was consistent with hydrocele and no testicular abnormalities.  He had initially elected observation however on a one-year follow-up visit had noted slight increase size with increased penile retraction.  After discussing management options he desires to proceed with hydrocelectomy   PMH: Past Medical History:  Diagnosis Date  . Arthritis    hands, knees and hips  . Diabetes mellitus    diet control  . Diverticulitis   . Hypertension   . Sleep apnea    uses CPAP    Surgical History: Past Surgical History:  Procedure Laterality Date  . COLONOSCOPY WITH PROPOFOL N/A 03/12/2018   Procedure: COLONOSCOPY WITH PROPOFOL;  Surgeon: Toledo, Benay Pike, MD;  Location: [facility] ENDOSCOPY;  Service: Gastroenterology;  Laterality: N/A;  . HIP SURGERY    . JOINT REPLACEMENT Right 02/14/2016  . NASAL SEPTUM SURGERY    . TONSILLECTOMY    . TOTAL HIP ARTHROPLASTY Right 02/14/2016   Procedure: TOTAL HIP ARTHROPLASTY ANTERIOR APPROACH;  Surgeon: Hessie Knows, MD;  Location: [facility] ORS;  Service: Orthopedics;  Laterality: Right;    Home Medications:  Reviewed  Allergies:  Allergies  Allergen Reactions  . Tramadol Other (See Comments)    "increases pain"    Family History: Family History  Problem Relation Age of Onset  . Kidney disease Paternal Grandfather   . Prostate cancer Neg Hx   . Bladder Cancer Neg Hx   . Kidney cancer Neg Hx     Social History:  reports that he has been smoking. He has a 40.00 pack-year smoking history. He has never used smokeless tobacco. He reports previous alcohol use. He reports that he does not use  drugs.  ROS: Otherwise noncontributory except as per the HPI  Physical Exam: BP 137/84   Pulse 76   Temp 97.6 F (36.4 C) (Oral)   Resp 14   SpO2 97%   Constitutional:  Alert and oriented, No acute distress. HEENT: [place] AT, moist mucus membranes.  Trachea midline, no masses. Cardiovascular: No clubbing, cyanosis, or edema.  RRR Respiratory: Normal respiratory effort, no increased work of breathing.  Clear GI: Abdomen is soft, nontender, nondistended, no abdominal masses GU: Large left hydrocele with penile retraction based on size Skin: No rashes, bruises or suspicious lesions. Neurologic: Grossly intact, no focal deficits, moving all 4 extremities. Psychiatric: Normal mood and affect.  Assessment & Plan:   63 year old male with a symptomatic left hydrocele who presents for hydrocelectomy.  Management options were previously discussed including continued observation and aspiration.  The procedure was cussed in detail including potential risks of bleeding/hematoma, infection, recurrence.  The use of a postoperative drain was also discussed.  He indicated all questions were answered and desires to proceed.   Abbie Sons, [place] [address] [place], [place] [address] [phone]

## 2019-04-08 NOTE — Telephone Encounter (Signed)
Appt made. LMOM

## 2019-04-09 ENCOUNTER — Encounter: Payer: Self-pay | Admitting: Urology

## 2019-04-09 NOTE — Telephone Encounter (Signed)
-----   Message from Abbie Sons, MD sent at 04/08/2019  4:38 PM EDT ----- Will also need a postop follow-up in approximately 4 weeks

## 2019-04-09 NOTE — Telephone Encounter (Signed)
App made ° ° °[name]  °

## 2019-04-10 ENCOUNTER — Ambulatory Visit (INDEPENDENT_AMBULATORY_CARE_PROVIDER_SITE_OTHER): Admitting: Urology

## 2019-04-10 ENCOUNTER — Ambulatory Visit

## 2019-04-10 ENCOUNTER — Other Ambulatory Visit: Payer: Self-pay

## 2019-04-10 ENCOUNTER — Encounter: Payer: Self-pay | Admitting: Urology

## 2019-04-10 VITALS — BP 148/84 | HR 95 | Ht 71.65 in | Wt 374.0 lb

## 2019-04-10 DIAGNOSIS — Z9889 Other specified postprocedural states: Secondary | ICD-10-CM

## 2019-04-10 DIAGNOSIS — N433 Hydrocele, unspecified: Secondary | ICD-10-CM

## 2019-04-10 DIAGNOSIS — E1169 Type 2 diabetes mellitus with other specified complication: Secondary | ICD-10-CM | POA: Insufficient documentation

## 2019-04-10 DIAGNOSIS — E669 Obesity, unspecified: Secondary | ICD-10-CM | POA: Insufficient documentation

## 2019-04-12 ENCOUNTER — Encounter: Payer: Self-pay | Admitting: Urology

## 2019-04-12 NOTE — Progress Notes (Signed)
63 year old male status post left hydrocelectomy 04/08/2019 who presents today for drain removal.  He has had no significant drainage in the past 24 hours.  He has no complaints.  Exam: Moderate left hemiscrotal swelling.  Minimal drainage present from gauze covering Penrose.  Incision clean and dry.  Impression: Doing well status post left hydrocelectomy.  Plan: Drain was removed without difficulty.  May shower in 24 hours.  No tub bath for 7-10 days.  Postop visit approximately 1 month.

## 2019-05-06 ENCOUNTER — Telehealth: Payer: Self-pay | Admitting: *Deleted

## 2019-05-06 NOTE — Telephone Encounter (Signed)
Contacted in attempt to schedule lung screening, patient reports his MD told him he did not need one this year. Patient is made aware of national recommendations for annual screening. Patient will call me if he does want to schedule lung screening.

## 2019-05-08 ENCOUNTER — Ambulatory Visit (INDEPENDENT_AMBULATORY_CARE_PROVIDER_SITE_OTHER): Admitting: Urology

## 2019-05-08 ENCOUNTER — Encounter: Payer: Self-pay | Admitting: Urology

## 2019-05-08 ENCOUNTER — Other Ambulatory Visit: Payer: Self-pay

## 2019-05-08 VITALS — BP 129/73 | HR 81 | Ht 71.65 in | Wt 374.0 lb

## 2019-05-08 DIAGNOSIS — Z9889 Other specified postprocedural states: Secondary | ICD-10-CM

## 2019-05-08 LAB — URINALYSIS, COMPLETE
Bilirubin, UA: NEGATIVE
Glucose, UA: NEGATIVE
Ketones, UA: NEGATIVE
Leukocytes,UA: NEGATIVE
Nitrite, UA: NEGATIVE
Protein,UA: NEGATIVE
RBC, UA: NEGATIVE
Specific Gravity, UA: 1.025 (ref 1.005–1.030)
Urobilinogen, Ur: 0.2 mg/dL (ref 0.2–1.0)
pH, UA: 5.5 (ref 5.0–7.5)

## 2019-05-08 LAB — MICROSCOPIC EXAMINATION
Bacteria, UA: NONE SEEN
RBC, Urine: NONE SEEN /hpf (ref 0–2)

## 2019-05-08 NOTE — Progress Notes (Signed)
05/08/2019 11:12 AM   Erin HearingMichael Sean Cunningham 09/08/1956 [id number]  Referring provider: Marisue IvanLinthavong, Kanhka, MD [phone] [facility] [place],  [place] [id number]  Chief Complaint  Patient presents with  . Routine Post Op    HPI: 63 year old male presents for postop follow-up.  He is status post a left hydrocelectomy on 04/08/2019.  He had no postoperative problems and states the swelling has decreased and is continuing to do so.   PMH: Past Medical History:  Diagnosis Date  . Arthritis    hands, knees and hips  . Diabetes mellitus    diet control  . Diverticulitis   . Hypertension   . Sleep apnea    uses CPAP    Surgical History: Past Surgical History:  Procedure Laterality Date  . COLONOSCOPY WITH PROPOFOL N/A 03/12/2018   Procedure: COLONOSCOPY WITH PROPOFOL;  Surgeon: Toledo, Boykin Nearingeodoro K, MD;  Location: [facility] ENDOSCOPY;  Service: Gastroenterology;  Laterality: N/A;  . HIP SURGERY    . HYDROCELE EXCISION Left 04/08/2019   Procedure: HYDROCELECTOMY ADULT;  Surgeon: Riki AltesStoioff, Valda Christenson C, MD;  Location: [facility] ORS;  Service: Urology;  Laterality: Left;  . JOINT REPLACEMENT Right 02/14/2016  . NASAL SEPTUM SURGERY    . TONSILLECTOMY    . TOTAL HIP ARTHROPLASTY Right 02/14/2016   Procedure: TOTAL HIP ARTHROPLASTY ANTERIOR APPROACH;  Surgeon: Kennedy BuckerMichael Menz, MD;  Location: [facility] ORS;  Service: Orthopedics;  Laterality: Right;    Home Medications:  Allergies as of 05/08/2019      Reactions   Tramadol Other (See Comments)   "increases pain"      Medication List       Accurate as of May 08, 2019 11:12 AM. If you have any questions, ask your nurse or doctor.        STOP taking these medications   HYDROcodone-acetaminophen 5-325 MG tablet Commonly known as: NORCO/VICODIN Stopped by: Riki AltesScott C Debrah Granderson, MD     TAKE these medications   acetaminophen 500 MG tablet Commonly known as: TYLENOL Take 500 mg by mouth every 6 (six) hours as needed for moderate  pain.   aspirin EC 81 MG tablet Take 81 mg by mouth every 6 (six) hours as needed for moderate pain.   aspirin-acetaminophen-caffeine 250-250-65 MG tablet Commonly known as: EXCEDRIN MIGRAINE Take 2 tablets by mouth every 6 (six) hours as needed for headache.   metFORMIN 500 MG tablet Commonly known as: GLUCOPHAGE Take 500 mg by mouth 2 (two) times daily with a meal.   Precision QID Test test strip Generic drug: glucose blood Use 1 strip once daily Use as instructed. Checks blood sugar occasionally ACCU-CHEK   telmisartan 80 MG tablet Commonly known as: MICARDIS Take 80 mg by mouth at bedtime.   VICKS NYQUIL COUGH PO Take by mouth.       Allergies:  Allergies  Allergen Reactions  . Tramadol Other (See Comments)    "increases pain"    Family History: Family History  Problem Relation Age of Onset  . Kidney disease Paternal Grandfather   . Prostate cancer Neg Hx   . Bladder Cancer Neg Hx   . Kidney cancer Neg Hx     Social History:  reports that he has been smoking. He has a 40.00 pack-year smoking history. He has never used smokeless tobacco. He reports previous alcohol use. He reports that he does not use drugs.  ROS: UROLOGY Frequent Urination?: No Hard to postpone urination?: No Burning/pain with urination?: No Get up at night to  urinate?: No Leakage of urine?: No Urine stream starts and stops?: No Trouble starting stream?: No Do you have to strain to urinate?: No Blood in urine?: No Urinary tract infection?: No Sexually transmitted disease?: No Injury to kidneys or bladder?: No Painful intercourse?: No Weak stream?: No Erection problems?: No Penile pain?: No  Gastrointestinal Nausea?: No Vomiting?: No Indigestion/heartburn?: No Diarrhea?: No Constipation?: No  Constitutional Fever: No Night sweats?: No Weight loss?: No Fatigue?: No  Skin Skin rash/lesions?: No Itching?: No  Eyes Blurred vision?: No Double vision?: No   Ears/Nose/Throat Sore throat?: No Sinus problems?: No  Hematologic/Lymphatic Swollen glands?: No Easy bruising?: No  Cardiovascular Leg swelling?: No Chest pain?: No  Respiratory Cough?: No Shortness of breath?: No  Endocrine Excessive thirst?: No  Musculoskeletal Back pain?: No Joint pain?: No  Neurological Headaches?: No Dizziness?: No  Psychologic Depression?: No Anxiety?: No  Physical Exam: BP 129/73 (BP Location: Left Arm, Patient Position: Sitting, Cuff Size: Large)   Pulse 81   Ht 5' 11.65" (1.82 m)   Wt (!) 374 lb (169.6 kg)   BMI 51.22 kg/m   Constitutional:  Alert and oriented, No acute distress. HEENT: [place] AT, moist mucus membranes.  Trachea midline, no masses. Cardiovascular: No clubbing, cyanosis, or edema. Respiratory: Normal respiratory effort, no increased work of breathing. GU: The scrotal incision has healed.  There is moderate left hemiscrotal swelling.  No erythema or drainage.  No tenderness present.  Some firmness present medially which may be hematoma.   Assessment & Plan:    - Status post hydrocelectomy He has more swelling than I would like to see at 1 month postop.  He may have a hematoma but no evidence of infection and no tenderness.  We will continue to follow and see back in 6 weeks for reexam.  Scrotal ultrasound for persistent swelling.   Abbie Sons, [place] [address] [place], [place] [address] [phone]

## 2019-05-17 ENCOUNTER — Encounter: Payer: Self-pay | Admitting: Urology

## 2019-05-18 ENCOUNTER — Telehealth: Payer: Self-pay | Admitting: Urology

## 2019-05-18 DIAGNOSIS — N5089 Other specified disorders of the male genital organs: Secondary | ICD-10-CM

## 2019-05-18 NOTE — Telephone Encounter (Signed)
Jerome Cunningham status post left hydrocelectomy on 7/15.  I saw him in follow-up on 8/14 and he still had a fair amount of swelling though no symptoms otherwise.  He sent a MyChart message yesterday regarding drainage from his incision.  I called him this morning and he noted some drainage from the incision that started on 8/20.  He denies increased swelling, pain or fever.  He states there is mild irritation at the incision site.  I recommended a scrotal ultrasound within the next 24 hours.  Will call with results after reviewing and defer further recommendations until that time.  He was instructed to call back should he develop increasing pain or fever.

## 2019-05-20 ENCOUNTER — Other Ambulatory Visit: Payer: Self-pay

## 2019-05-20 ENCOUNTER — Ambulatory Visit
Admission: RE | Admit: 2019-05-20 | Discharge: 2019-05-20 | Disposition: A | Discharge status: Home / Self Care | Source: Ambulatory Visit | Attending: Urology | Admitting: Urology

## 2019-05-20 DIAGNOSIS — N5089 Other specified disorders of the male genital organs: Secondary | ICD-10-CM | POA: Diagnosis not present

## 2019-05-22 ENCOUNTER — Other Ambulatory Visit: Payer: Self-pay

## 2019-05-22 ENCOUNTER — Ambulatory Visit (INDEPENDENT_AMBULATORY_CARE_PROVIDER_SITE_OTHER): Admitting: Urology

## 2019-05-22 ENCOUNTER — Encounter: Payer: Self-pay | Admitting: Urology

## 2019-05-22 VITALS — BP 131/70 | HR 86 | Ht 71.95 in | Wt 374.0 lb

## 2019-05-22 DIAGNOSIS — K4090 Unilateral inguinal hernia, without obstruction or gangrene, not specified as recurrent: Secondary | ICD-10-CM

## 2019-05-25 ENCOUNTER — Encounter: Payer: Self-pay | Admitting: Urology

## 2019-05-25 NOTE — Progress Notes (Signed)
05/22/2019 9:01 AM   Jerome Cunningham 08/27/1956 [id number]  Referring provider: Marisue IvanLinthavong, Kanhka, MD [phone] [facility] [address] [facility] [facility] [place],  [place] [id number]  Chief Complaint  Patient presents with  . Wound Check    HPI: 63 y.o. male with a history of left hydrocelectomy.  He was seen earlier this month for postop follow-up and left hemiscrotal swelling was noted.  Several days later he began to notice some drainage from his incision though no fever, chills or erythema.  A scrotal ultrasound was recommended which was performed earlier this week and it showed a left inguinal hernia with omental fat.  His prior scrotal ultrasound only showed a left hydrocele with no evidence of hernia however he did have a CT scan prior to the ultrasound which did show a left inguinal hernia containing proximal sigmoid colon.   PMH: Past Medical History:  Diagnosis Date  . Arthritis    hands, knees and hips  . Diabetes mellitus    diet control  . Diverticulitis   . Hypertension   . Sleep apnea    uses CPAP    Surgical History: Past Surgical History:  Procedure Laterality Date  . COLONOSCOPY WITH PROPOFOL N/A 03/12/2018   Procedure: COLONOSCOPY WITH PROPOFOL;  Surgeon: Toledo, Boykin Nearingeodoro K, MD;  Location: [facility];  Service: Gastroenterology;  Laterality: N/A;  . HIP SURGERY    . HYDROCELE EXCISION Left 04/08/2019   Procedure: HYDROCELECTOMY ADULT;  Surgeon: Riki AltesStoioff, Scott C, MD;  Location: [facility];  Service: Urology;  Laterality: Left;  . JOINT REPLACEMENT Right 02/14/2016  . NASAL SEPTUM SURGERY    . TONSILLECTOMY    . TOTAL HIP ARTHROPLASTY Right 02/14/2016   Procedure: TOTAL HIP ARTHROPLASTY ANTERIOR APPROACH;  Surgeon: Kennedy BuckerMichael Menz, MD;  Location: [facility];  Service: Orthopedics;  Laterality: Right;    Home Medications:  Allergies as of 05/22/2019      Reactions   Tramadol Other (See Comments)   "increases pain"      Medication List       Accurate as of  May 22, 2019 11:59 PM. If you have any questions, ask your nurse or doctor.        STOP taking these medications   VICKS NYQUIL COUGH PO Stopped by: Riki AltesScott C Stoioff, MD     TAKE these medications   acetaminophen 500 MG tablet Commonly known as: TYLENOL Take 500 mg by mouth every 6 (six) hours as needed for moderate pain.   aspirin EC 81 MG tablet Take 81 mg by mouth every 6 (six) hours as needed for moderate pain.   aspirin-acetaminophen-caffeine 250-250-65 MG tablet Commonly known as: EXCEDRIN MIGRAINE Take 2 tablets by mouth every 6 (six) hours as needed for headache.   metFORMIN 500 MG tablet Commonly known as: GLUCOPHAGE Take 500 mg by mouth 2 (two) times daily with a meal.   Precision QID Test test strip Generic drug: glucose blood Use 1 strip once daily Use as instructed. Checks blood sugar occasionally ACCU-CHEK   telmisartan 80 MG tablet Commonly known as: MICARDIS Take 80 mg by mouth at bedtime.       Allergies:  Allergies  Allergen Reactions  . Tramadol Other (See Comments)    "increases pain"    Family History: Family History  Problem Relation Age of Onset  . Kidney disease Paternal Grandfather   . Prostate cancer Neg Hx   . Bladder Cancer Neg Hx   . Kidney cancer Neg Hx  Social History:  reports that he has been smoking. He has a 40.00 pack-year smoking history. He has never used smokeless tobacco. He reports previous alcohol use. He reports that he does not use drugs.  ROS: UROLOGY Frequent Urination?: No Hard to postpone urination?: No Burning/pain with urination?: No Get up at night to urinate?: No Leakage of urine?: No Urine stream starts and stops?: No Trouble starting stream?: No Do you have to strain to urinate?: No Blood in urine?: No Urinary tract infection?: No Sexually transmitted disease?: No Injury to kidneys or bladder?: No Painful intercourse?: No Weak stream?: No Erection problems?: No Penile pain?: No   Gastrointestinal Nausea?: No Vomiting?: No Indigestion/heartburn?: No Diarrhea?: No Constipation?: No  Constitutional Fever: No Night sweats?: No Weight loss?: No Fatigue?: No  Skin Skin rash/lesions?: No Itching?: No  Eyes Blurred vision?: No Double vision?: No  Ears/Nose/Throat Sore throat?: No Sinus problems?: No  Hematologic/Lymphatic Swollen glands?: No Easy bruising?: No  Cardiovascular Leg swelling?: No Chest pain?: No  Respiratory Cough?: No Shortness of breath?: No  Endocrine Excessive thirst?: No  Musculoskeletal Back pain?: No Joint pain?: No  Neurological Headaches?: No Dizziness?: No  Psychologic Depression?: No Anxiety?: No  Physical Exam: BP 131/70 (BP Location: Left Arm, Patient Position: Sitting, Cuff Size: Large)   Pulse 86   Ht 5' 11.95" (1.828 m)   Wt (!) 374 lb (169.6 kg)   BMI 50.79 kg/m   Constitutional:  Alert and oriented, No acute distress. HEENT: [place] AT, moist mucus membranes.  Trachea midline, no masses. Cardiovascular: No clubbing, cyanosis, or edema. Respiratory: Normal respiratory effort, no increased work of breathing. GI: Abdomen is soft, nontender, nondistended, no abdominal masses GU: Incision has had mild separation.  No significant drainage noted.  Left hemiscrotal swelling but no erythema or fluctuance. Lymph: No cervical or inguinal lymphadenopathy. Skin: No rashes, bruises or suspicious lesions. Neurologic: Grossly intact, no focal deficits, moving all 4 extremities. Psychiatric: Normal mood and affect.   Assessment & Plan:    Status post hydrocelectomy.  He has an asymptomatic left inguinal hernia.  I offered general surgery referral however he wanted to wait until he saw Dr. Netty Starring at his next visit.  We discussed potential signs of incarceration which would require the need for more urgent evaluation.  Abbie Sons, [place] [address] [place]  [place], [place] [address] [phone]

## 2019-06-26 ENCOUNTER — Encounter: Payer: Self-pay | Admitting: Urology

## 2019-06-26 ENCOUNTER — Ambulatory Visit (INDEPENDENT_AMBULATORY_CARE_PROVIDER_SITE_OTHER): Admitting: Urology

## 2019-06-26 ENCOUNTER — Other Ambulatory Visit: Payer: Self-pay

## 2019-06-26 VITALS — BP 156/80 | HR 92 | Ht 71.95 in | Wt 374.0 lb

## 2019-06-26 DIAGNOSIS — K4091 Unilateral inguinal hernia, without obstruction or gangrene, recurrent: Secondary | ICD-10-CM

## 2019-06-26 NOTE — Progress Notes (Signed)
06/26/2019 11:29 AM   Jerome Cunningham 1956-06-06 [id number]  Referring provider: Dion Body, MD [facility] [place],  [place] [address]  Chief Complaint  Patient presents with  . Follow-up    HPI: Mr. Turay presents for hydrocelectomy follow-up.  He is status post left hydrocelectomy on 04/08/2019.  Preop scrotal ultrasound showed hydrocele without evidence of hernia.  Postoperatively he was noted to have left hemiscrotal swelling.  Follow-up ultrasound showed no evidence of abscess, hematoma or persistent hydrocele but a large inguinal hernia.  He saw Dr. Netty Starring and it was recommended observation unless he was having significant symptoms.  His only bother is mild discomfort with clothing due to the scrotal size.   PMH: Past Medical History:  Diagnosis Date  . Arthritis    hands, knees and hips  . Diabetes mellitus    diet control  . Diverticulitis   . Hypertension   . Sleep apnea    uses CPAP    Surgical History: Past Surgical History:  Procedure Laterality Date  . COLONOSCOPY WITH PROPOFOL N/A 03/12/2018   Procedure: COLONOSCOPY WITH PROPOFOL;  Surgeon: Toledo, Benay Pike, MD;  Location: [facility];  Service: Gastroenterology;  Laterality: N/A;  . HIP SURGERY    . HYDROCELE EXCISION Left 04/08/2019   Procedure: HYDROCELECTOMY ADULT;  Surgeon: Abbie Sons, MD;  Location: [facility];  Service: Urology;  Laterality: Left;  . JOINT REPLACEMENT Right 02/14/2016  . NASAL SEPTUM SURGERY    . TONSILLECTOMY    . TOTAL HIP ARTHROPLASTY Right 02/14/2016   Procedure: TOTAL HIP ARTHROPLASTY ANTERIOR APPROACH;  Surgeon: Hessie Knows, MD;  Location: [facility];  Service: Orthopedics;  Laterality: Right;    Home Medications:  Allergies as of 06/26/2019      Reactions   Tramadol Other (See Comments)   "increases pain"      Medication List       Accurate as of June 26, 2019 11:29 AM. If you have any questions, ask your  nurse or doctor.        acetaminophen 500 MG tablet Commonly known as: TYLENOL Take 500 mg by mouth every 6 (six) hours as needed for moderate pain.   aspirin EC 81 MG tablet Take 81 mg by mouth every 6 (six) hours as needed for moderate pain.   aspirin-acetaminophen-caffeine 250-250-65 MG tablet Commonly known as: EXCEDRIN MIGRAINE Take 2 tablets by mouth every 6 (six) hours as needed for headache.   metFORMIN 500 MG tablet Commonly known as: GLUCOPHAGE Take 500 mg by mouth 2 (two) times daily with a meal.   Precision QID Test test strip Generic drug: glucose blood Use 1 strip once daily Use as instructed. Checks blood sugar occasionally ACCU-CHEK   telmisartan 80 MG tablet Commonly known as: MICARDIS Take 80 mg by mouth at bedtime.       Allergies:  Allergies  Allergen Reactions  . Tramadol Other (See Comments)    "increases pain"    Family History: Family History  Problem Relation Age of Onset  . Kidney disease Paternal Grandfather   . Prostate cancer Neg Hx   . Bladder Cancer Neg Hx   . Kidney cancer Neg Hx     Social History:  reports that he has been smoking. He has a 40.00 pack-year smoking history. He has never used smokeless tobacco. He reports previous alcohol use. He reports that he does not use drugs.  ROS: UROLOGY Frequent Urination?: No Hard to postpone urination?: No Burning/pain  with urination?: No Get up at night to urinate?: No Leakage of urine?: No Urine stream starts and stops?: No Trouble starting stream?: No Do you have to strain to urinate?: No Blood in urine?: No Urinary tract infection?: No Sexually transmitted disease?: No Injury to kidneys or bladder?: No Painful intercourse?: No Weak stream?: No Erection problems?: No Penile pain?: No  Gastrointestinal Nausea?: No Vomiting?: No Indigestion/heartburn?: No Diarrhea?: No Constipation?: No  Constitutional Fever: No Night sweats?: No Weight loss?: No Fatigue?: No   Skin Skin rash/lesions?: No Itching?: No  Eyes Blurred vision?: No Double vision?: No  Ears/Nose/Throat Sore throat?: No Sinus problems?: Yes  Hematologic/Lymphatic Swollen glands?: No Easy bruising?: No  Cardiovascular Leg swelling?: No Chest pain?: No  Respiratory Cough?: No Shortness of breath?: No  Endocrine Excessive thirst?: No  Musculoskeletal Back pain?: No Joint pain?: Yes  Neurological Headaches?: No Dizziness?: No  Psychologic Depression?: No Anxiety?: No  Physical Exam: BP (!) 156/80 (BP Location: Left Arm, Patient Position: Sitting, Cuff Size: Large)   Pulse 92   Ht 5' 11.95" (1.828 m)   Wt (!) 374 lb (169.6 kg)   BMI 50.79 kg/m   Constitutional:  Alert and oriented, No acute distress. HEENT: [place] AT, moist mucus membranes.  Trachea midline, no masses. Cardiovascular: No clubbing, cyanosis, or edema. Respiratory: Normal respiratory effort, no increased work of breathing. GI: Abdomen is soft, nontender, nondistended, no abdominal masses GU: Scrotal incision has healed.  The left testis is easily palpable and nontender.  Large, soft supratesticular mass consistent with his known hernia.  Assessment & Plan:    - Status post hydrocelectomy Incision has healed.  Testis palpably normal  - Left inguinal hernia I recommended an initial evaluation by general surgery.  Referral was placed.  Riki Altes, MD  [facility] [address] [place], [place] [address] [phone]

## 2019-08-06 ENCOUNTER — Encounter: Payer: Self-pay | Admitting: Emergency Medicine

## 2019-08-06 ENCOUNTER — Inpatient Hospital Stay
Admission: EM | Admit: 2019-08-06 | Discharge: 2019-08-10 | DRG: 872 | Disposition: A | Discharge status: Home / Self Care | Attending: Internal Medicine | Admitting: Internal Medicine

## 2019-08-06 ENCOUNTER — Other Ambulatory Visit: Payer: Self-pay

## 2019-08-06 ENCOUNTER — Emergency Department

## 2019-08-06 DIAGNOSIS — R509 Fever, unspecified: Secondary | ICD-10-CM | POA: Diagnosis present

## 2019-08-06 DIAGNOSIS — A4151 Sepsis due to Escherichia coli [E. coli]: Secondary | ICD-10-CM | POA: Diagnosis not present

## 2019-08-06 DIAGNOSIS — J449 Chronic obstructive pulmonary disease, unspecified: Secondary | ICD-10-CM | POA: Diagnosis present

## 2019-08-06 DIAGNOSIS — E119 Type 2 diabetes mellitus without complications: Secondary | ICD-10-CM | POA: Diagnosis not present

## 2019-08-06 DIAGNOSIS — Z20828 Contact with and (suspected) exposure to other viral communicable diseases: Secondary | ICD-10-CM | POA: Diagnosis present

## 2019-08-06 DIAGNOSIS — G4733 Obstructive sleep apnea (adult) (pediatric): Secondary | ICD-10-CM | POA: Diagnosis not present

## 2019-08-06 DIAGNOSIS — I10 Essential (primary) hypertension: Secondary | ICD-10-CM | POA: Diagnosis not present

## 2019-08-06 DIAGNOSIS — Z6841 Body Mass Index (BMI) 40.0 and over, adult: Secondary | ICD-10-CM | POA: Diagnosis not present

## 2019-08-06 DIAGNOSIS — A499 Bacterial infection, unspecified: Secondary | ICD-10-CM | POA: Diagnosis present

## 2019-08-06 DIAGNOSIS — Z7984 Long term (current) use of oral hypoglycemic drugs: Secondary | ICD-10-CM

## 2019-08-06 DIAGNOSIS — A419 Sepsis, unspecified organism: Secondary | ICD-10-CM

## 2019-08-06 DIAGNOSIS — R6520 Severe sepsis without septic shock: Secondary | ICD-10-CM | POA: Diagnosis present

## 2019-08-06 DIAGNOSIS — Z96641 Presence of right artificial hip joint: Secondary | ICD-10-CM | POA: Diagnosis not present

## 2019-08-06 DIAGNOSIS — Z720 Tobacco use: Secondary | ICD-10-CM | POA: Diagnosis present

## 2019-08-06 DIAGNOSIS — Z79899 Other long term (current) drug therapy: Secondary | ICD-10-CM

## 2019-08-06 DIAGNOSIS — Z7982 Long term (current) use of aspirin: Secondary | ICD-10-CM

## 2019-08-06 DIAGNOSIS — F17210 Nicotine dependence, cigarettes, uncomplicated: Secondary | ICD-10-CM | POA: Diagnosis not present

## 2019-08-06 LAB — COMPREHENSIVE METABOLIC PANEL
ALT: 32 U/L (ref 0–44)
AST: 31 U/L (ref 15–41)
Albumin: 4 g/dL (ref 3.5–5.0)
Alkaline Phosphatase: 63 U/L (ref 38–126)
Anion gap: 10 (ref 5–15)
BUN: 17 mg/dL (ref 8–23)
CO2: 22 mmol/L (ref 22–32)
Calcium: 9.3 mg/dL (ref 8.9–10.3)
Chloride: 104 mmol/L (ref 98–111)
Creatinine, Ser: 0.83 mg/dL (ref 0.61–1.24)
GFR calc Af Amer: >60 mL/min (ref >60)
GFR calc non Af Amer: >60 mL/min (ref >60)
Glucose, Bld: 109 mg/dL — ABNORMAL HIGH (ref 70–99)
Potassium: 3.9 mmol/L (ref 3.5–5.1)
Sodium: 136 mmol/L (ref 135–145)
Total Bilirubin: 0.6 mg/dL (ref 0.3–1.2)
Total Protein: 7.4 g/dL (ref 6.5–8.1)

## 2019-08-06 LAB — URINALYSIS, COMPLETE (UACMP) WITH MICROSCOPIC
Bacteria, UA: NONE SEEN
Bilirubin Urine: NEGATIVE
Glucose, UA: NEGATIVE mg/dL
Ketones, ur: NEGATIVE mg/dL
Leukocytes,Ua: NEGATIVE
Nitrite: NEGATIVE
Protein, ur: NEGATIVE mg/dL
Specific Gravity, Urine: 1.014 (ref 1.005–1.030)
Squamous Epithelial / HPF: NONE SEEN (ref 0–5)
pH: 5 (ref 5.0–8.0)

## 2019-08-06 LAB — CBC WITH DIFFERENTIAL/PLATELET
Abs Immature Granulocytes: 0.04 10*3/uL (ref 0.00–0.07)
Basophils Absolute: 0.1 10*3/uL (ref 0.0–0.1)
Basophils Relative: 1 %
Eosinophils Absolute: 0.1 10*3/uL (ref 0.0–0.5)
Eosinophils Relative: 1 %
HCT: 40.9 % (ref 39.0–52.0)
Hemoglobin: 13.5 g/dL (ref 13.0–17.0)
Immature Granulocytes: 0 %
Lymphocytes Relative: 7 %
Lymphs Abs: 1 10*3/uL (ref 0.7–4.0)
MCH: 28.7 pg (ref 26.0–34.0)
MCHC: 33 g/dL (ref 30.0–36.0)
MCV: 87 fL (ref 80.0–100.0)
Monocytes Absolute: 0.5 10*3/uL (ref 0.1–1.0)
Monocytes Relative: 4 %
Neutro Abs: 12.4 10*3/uL — ABNORMAL HIGH (ref 1.7–7.7)
Neutrophils Relative %: 87 %
Platelets: 264 10*3/uL (ref 150–400)
RBC: 4.7 MIL/uL (ref 4.22–5.81)
RDW: 13.7 % (ref 11.5–15.5)
WBC: 14.1 10*3/uL — ABNORMAL HIGH (ref 4.0–10.5)
nRBC: 0 % (ref 0.0–0.2)

## 2019-08-06 LAB — LACTIC ACID, PLASMA: Lactic Acid, Venous: 1.7 mmol/L (ref 0.5–1.9)

## 2019-08-06 MED ORDER — ACETAMINOPHEN 325 MG PO TABS
650.0000 mg | ORAL_TABLET | Freq: Once | ORAL | Status: AC
  Administered 2019-08-06: 20:00:00 650 mg via ORAL
  Filled 2019-08-06: qty 2

## 2019-08-06 NOTE — ED Triage Notes (Signed)
Pt in via POV, reports headache, sore throat, fever, chills x one days.  Febrile 103.1 upon arrival.  NAD noted at this time.

## 2019-08-06 NOTE — ED Provider Notes (Addendum)
[facility] Emergency Department Provider Note  ____________________________________________  Time seen: Approximately 10:17 PM  I have reviewed the triage vital signs and the nursing notes.   HISTORY  Chief Complaint Fever    HPI Jerome Cunningham is a 63 y.o. male presents to the emergency department with fever for the past 2 days and chills.  Patient states that he has had a productive cough.  He denies new onset chest pain, chest tightness or shortness of breath.  He has intermittent diarrhea.  No new rash.  He denies abdominal pain.  No dysuria, hematuria or increased urinary frequency.  No known contacts with COVID-19.  No other alleviating measures have been attempted.        Past Medical History:  Diagnosis Date  . Arthritis    hands, knees and hips  . Diabetes mellitus    diet control  . Diverticulitis   . Hypertension   . Sleep apnea    uses CPAP    Patient Active Problem List   Diagnosis Date Noted  . Type 2 diabetes mellitus with obesity ([place]) 04/10/2019  . OSA on CPAP 04/03/2018  . Diabetes mellitus without complication ([place]) 76/28/3151  . Hypertension, essential 12/09/2017  . Low testosterone 12/09/2017  . Morbid obesity due to excess calories ([place]) 12/09/2017  . Osteoarthritis 12/09/2017  . Tobacco dependence 12/09/2017  . Hydrocele 11/26/2017  . History of normocytic normochromic anemia 04/19/2017  . Primary osteoarthritis of right hip 02/14/2016  . Vaccine counseling 11/15/2014    Past Surgical History:  Procedure Laterality Date  . COLONOSCOPY WITH PROPOFOL N/A 03/12/2018   Procedure: COLONOSCOPY WITH PROPOFOL;  Surgeon: Toledo, Benay Pike, MD;  Location: [facility];  Service: Gastroenterology;  Laterality: N/A;  . HIP SURGERY    . HYDROCELE EXCISION Left 04/08/2019   Procedure: HYDROCELECTOMY ADULT;  Surgeon: Abbie Sons, MD;  Location: [facility];  Service: Urology;  Laterality: Left;  . JOINT REPLACEMENT  Right 02/14/2016  . NASAL SEPTUM SURGERY    . TONSILLECTOMY    . TOTAL HIP ARTHROPLASTY Right 02/14/2016   Procedure: TOTAL HIP ARTHROPLASTY ANTERIOR APPROACH;  Surgeon: Hessie Knows, MD;  Location: [facility];  Service: Orthopedics;  Laterality: Right;    Prior to Admission medications   Medication Sig Start Date End Date Taking? Authorizing Provider  acetaminophen (TYLENOL) 500 MG tablet Take 500 mg by mouth every 6 (six) hours as needed for moderate pain.     [provider]  aspirin EC 81 MG tablet Take 81 mg by mouth every 6 (six) hours as needed for moderate pain.     [provider]  aspirin-acetaminophen-caffeine (EXCEDRIN MIGRAINE) [phone] MG per tablet Take 2 tablets by mouth every 6 (six) hours as needed for headache.     [provider]  glucose blood (PRECISION QID TEST) test strip Use 1 strip once daily Use as instructed. Checks blood sugar occasionally ACCU-CHEK    [provider]  metFORMIN (GLUCOPHAGE) 500 MG tablet Take 500 mg by mouth 2 (two) times daily with a meal.  09/11/17   [provider]  telmisartan (MICARDIS) 80 MG tablet Take 80 mg by mouth at bedtime.     [provider]    Allergies Tramadol  Family History  Problem Relation Age of Onset  . Kidney disease Paternal Grandfather   . Prostate cancer Neg Hx   . Bladder Cancer Neg Hx   . Kidney cancer Neg Hx     Social History Social  History   Tobacco Use  . Smoking status: Current Every Day Smoker    Packs/day: 1.00    Years: 40.00    Pack years: 40.00  . Smokeless tobacco: Never Used  Substance Use Topics  . Alcohol use: Not Currently  . Drug use: No     Review of Systems  Constitutional: Patient has fever.  Eyes: No visual changes. No discharge ENT: No upper respiratory complaints. Cardiovascular: no chest pain. Respiratory: Patient has cough. No SOB. Gastrointestinal: No abdominal pain.  No nausea, no vomiting.  No diarrhea.  No  constipation. Genitourinary: Negative for dysuria. No hematuria Musculoskeletal: Negative for musculoskeletal pain. Skin: Negative for rash, abrasions, lacerations, ecchymosis. Neurological: Negative for headaches, focal weakness or numbness.  ____________________________________________   PHYSICAL EXAM:  VITAL SIGNS: ED Triage Vitals  Enc Vitals Group     BP 08/06/19 1944 (!) 141/83     Pulse Rate 08/06/19 1944 (!) 118     Resp 08/06/19 1944 20     Temp 08/06/19 1944 (!) 103.1 F (39.5 C)     Temp Source 08/06/19 1944 Oral     SpO2 08/06/19 1944 94 %     Weight 08/06/19 1945 (!) 365 lb (165.6 kg)     Height 08/06/19 1945 6\' 1"  (1.854 m)     Head Circumference --      Peak Flow --      Pain Score 08/06/19 1945 0     Pain Loc --      Pain Edu? --      Excl. in GC? --      Constitutional: Alert and oriented. Patient is lying supine. Eyes: Conjunctivae are normal. PERRL. EOMI. Head: Atraumatic. ENT:      Ears: Tympanic membranes are mildly injected with mild effusion bilaterally.       Nose: No congestion/rhinnorhea.      Mouth/Throat: Mucous membranes are moist. Posterior pharynx is mildly erythematous.  Hematological/Lymphatic/Immunilogical: No cervical lymphadenopathy.  Cardiovascular: Normal rate, regular rhythm. Normal S1 and S2.  Good peripheral circulation. Respiratory: Normal respiratory effort without tachypnea or retractions. Lungs CTAB. Good air entry to the bases with no decreased or absent breath sounds. Gastrointestinal: Bowel sounds 4 quadrants. Soft and nontender to palpation. No guarding or rigidity. No palpable masses. No distention. No CVA tenderness. Musculoskeletal: Full range of motion to all extremities. No gross deformities appreciated. Neurologic:  Normal speech and language. No gross focal neurologic deficits are appreciated.  Skin:  Skin is warm, dry and intact. No rash noted. Psychiatric: Mood and affect are normal. Speech and behavior are  normal. Patient exhibits appropriate insight and judgement.   ____________________________________________   LABS (all labs ordered are listed, but only abnormal results are displayed)  Labs Reviewed  CBC WITH DIFFERENTIAL/PLATELET - Abnormal; Notable for the following components:      Result Value   WBC 14.1 (*)    Neutro Abs 12.4 (*)    All other components within normal limits  COMPREHENSIVE METABOLIC PANEL - Abnormal; Notable for the following components:   Glucose, Bld 109 (*)    All other components within normal limits  URINALYSIS, COMPLETE (UACMP) WITH MICROSCOPIC - Abnormal; Notable for the following components:   Color, Urine YELLOW (*)    APPearance CLEAR (*)    Hgb urine dipstick MODERATE (*)    All other components within normal limits  CULTURE, BLOOD (ROUTINE X 2)  CULTURE, BLOOD (ROUTINE X 2)  SARS CORONAVIRUS 2 (TAT 6-24 HRS)  LACTIC ACID,  PLASMA  LACTIC ACID, PLASMA   ____________________________________________  EKG   ____________________________________________  RADIOLOGY I personally viewed and evaluated these images as part of my medical decision making, as well as reviewing the written report by the radiologist.    Dg Chest 1 View  Result Date: 08/06/2019 CLINICAL DATA:  Fevers and chills for 1 day EXAM: CHEST  1 VIEW COMPARISON:  05/02/2018 FINDINGS: Cardiac shadows within normal limits. The lungs are well aerated bilaterally. No focal infiltrate or sizable effusion is seen. No bony abnormality is noted. IMPRESSION: No active disease. Electronically Signed   By: Alcide Clever M.D.   On: 08/06/2019 22:36    ____________________________________________    PROCEDURES  Procedure(s) performed:    Procedures    Medications  acetaminophen (TYLENOL) tablet 650 mg (650 mg Oral Given 08/06/19 1950)     ____________________________________________   INITIAL IMPRESSION / ASSESSMENT AND PLAN / ED COURSE  Pertinent labs & imaging results  that were available during my care of the patient were reviewed by me and considered in my medical decision making (see chart for details).  Review of the [place] CSRS was performed in accordance of the [facility] prior to dispensing any controlled drugs.           Assessment and Plan:  Fever:  63 year old male presents to the emergency department with fever for the past 2 days.  Patient was febrile and tachycardic at triage.  On physical exam, patient was resting comfortably with no increased work of breathing.  No adventitious lung sounds were auscultated.  CBC revealed mild leukocytosis, 14.1 with left shift.  CMP was reassuring.  Urinalysis revealed moderate blood but no leukocytes or nitrates.  Urine cultures and blood cultures are in process at this time.  Lactic acid was within reference range.  Chest x-ray revealed no consolidations, opacities or infiltrates that would suggest pneumonia.  COVID-19 testing is in process at this time.  Influenza testing in process at this time.  Attending Dr. Manson Passey assumed patient care as Flex transitioned to close.   ____________________________________________  FINAL CLINICAL IMPRESSION(S) / ED DIAGNOSES  Final diagnoses:  Fever in other diseases      NEW MEDICATIONS STARTED DURING THIS VISIT:  ED Discharge Orders    None          This chart was dictated using voice recognition software/Dragon. Despite best efforts to proofread, errors can occur which can change the meaning. Any change was purely unintentional.    Orvil Feil, PA-C 08/06/19 [id number]    Pia Mau [place], PA-C 08/07/19 Orpah Cobb    Shaune Pollack, MD 08/07/19 1059

## 2019-08-07 ENCOUNTER — Inpatient Hospital Stay

## 2019-08-07 DIAGNOSIS — E119 Type 2 diabetes mellitus without complications: Secondary | ICD-10-CM

## 2019-08-07 DIAGNOSIS — G4733 Obstructive sleep apnea (adult) (pediatric): Secondary | ICD-10-CM | POA: Diagnosis present

## 2019-08-07 DIAGNOSIS — Z96641 Presence of right artificial hip joint: Secondary | ICD-10-CM | POA: Diagnosis present

## 2019-08-07 DIAGNOSIS — Z6841 Body Mass Index (BMI) 40.0 and over, adult: Secondary | ICD-10-CM | POA: Diagnosis not present

## 2019-08-07 DIAGNOSIS — Z7984 Long term (current) use of oral hypoglycemic drugs: Secondary | ICD-10-CM | POA: Diagnosis not present

## 2019-08-07 DIAGNOSIS — I10 Essential (primary) hypertension: Secondary | ICD-10-CM | POA: Diagnosis present

## 2019-08-07 DIAGNOSIS — A499 Bacterial infection, unspecified: Secondary | ICD-10-CM | POA: Diagnosis present

## 2019-08-07 DIAGNOSIS — R6520 Severe sepsis without septic shock: Secondary | ICD-10-CM | POA: Diagnosis present

## 2019-08-07 DIAGNOSIS — J449 Chronic obstructive pulmonary disease, unspecified: Secondary | ICD-10-CM | POA: Diagnosis present

## 2019-08-07 DIAGNOSIS — A4151 Sepsis due to Escherichia coli [E. coli]: Secondary | ICD-10-CM | POA: Diagnosis present

## 2019-08-07 DIAGNOSIS — Z720 Tobacco use: Secondary | ICD-10-CM

## 2019-08-07 DIAGNOSIS — Z79899 Other long term (current) drug therapy: Secondary | ICD-10-CM | POA: Diagnosis not present

## 2019-08-07 DIAGNOSIS — F17210 Nicotine dependence, cigarettes, uncomplicated: Secondary | ICD-10-CM | POA: Diagnosis present

## 2019-08-07 DIAGNOSIS — R6510 Systemic inflammatory response syndrome (SIRS) of non-infectious origin without acute organ dysfunction: Secondary | ICD-10-CM | POA: Diagnosis not present

## 2019-08-07 DIAGNOSIS — R7881 Bacteremia: Secondary | ICD-10-CM | POA: Diagnosis not present

## 2019-08-07 DIAGNOSIS — Z7982 Long term (current) use of aspirin: Secondary | ICD-10-CM | POA: Diagnosis not present

## 2019-08-07 DIAGNOSIS — A419 Sepsis, unspecified organism: Secondary | ICD-10-CM | POA: Diagnosis present

## 2019-08-07 DIAGNOSIS — R509 Fever, unspecified: Secondary | ICD-10-CM | POA: Diagnosis present

## 2019-08-07 DIAGNOSIS — Z20828 Contact with and (suspected) exposure to other viral communicable diseases: Secondary | ICD-10-CM | POA: Diagnosis present

## 2019-08-07 DIAGNOSIS — Z1612 Extended spectrum beta lactamase (ESBL) resistance: Secondary | ICD-10-CM | POA: Diagnosis present

## 2019-08-07 LAB — BASIC METABOLIC PANEL
Anion gap: 10 (ref 5–15)
BUN: 15 mg/dL (ref 8–23)
CO2: 23 mmol/L (ref 22–32)
Calcium: 8.8 mg/dL — ABNORMAL LOW (ref 8.9–10.3)
Chloride: 103 mmol/L (ref 98–111)
Creatinine, Ser: 0.89 mg/dL (ref 0.61–1.24)
GFR calc Af Amer: >60 mL/min (ref >60)
GFR calc non Af Amer: >60 mL/min (ref >60)
Glucose, Bld: 135 mg/dL — ABNORMAL HIGH (ref 70–99)
Potassium: 4.1 mmol/L (ref 3.5–5.1)
Sodium: 136 mmol/L (ref 135–145)

## 2019-08-07 LAB — BLOOD CULTURE ID PANEL (REFLEXED)

## 2019-08-07 LAB — SARS CORONAVIRUS 2 (TAT 6-24 HRS): SARS Coronavirus 2: NEGATIVE

## 2019-08-07 LAB — HIV ANTIBODY (ROUTINE TESTING W REFLEX): HIV Screen 4th Generation wRfx: NONREACTIVE

## 2019-08-07 LAB — CBC
HCT: 39.6 % (ref 39.0–52.0)
Hemoglobin: 13.1 g/dL (ref 13.0–17.0)
MCH: 28.5 pg (ref 26.0–34.0)
MCHC: 33.1 g/dL (ref 30.0–36.0)
MCV: 86.3 fL (ref 80.0–100.0)
Platelets: 231 10*3/uL (ref 150–400)
RBC: 4.59 MIL/uL (ref 4.22–5.81)
RDW: 13.9 % (ref 11.5–15.5)
WBC: 10.9 10*3/uL — ABNORMAL HIGH (ref 4.0–10.5)
nRBC: 0 % (ref 0.0–0.2)

## 2019-08-07 LAB — GLUCOSE, CAPILLARY
Glucose-Capillary: 114 mg/dL — ABNORMAL HIGH (ref 70–99)
Glucose-Capillary: 164 mg/dL — ABNORMAL HIGH (ref 70–99)

## 2019-08-07 LAB — GROUP A STREP BY PCR: Group A Strep by PCR: NOT DETECTED

## 2019-08-07 LAB — INFLUENZA PANEL BY PCR (TYPE A & B)
Influenza A By PCR: NEGATIVE
Influenza B By PCR: NEGATIVE

## 2019-08-07 LAB — STREP PNEUMONIAE URINARY ANTIGEN: Strep Pneumo Urinary Antigen: NEGATIVE

## 2019-08-07 LAB — FIBRIN DERIVATIVES D-DIMER (ARMC ONLY): Fibrin derivatives D-dimer (ARMC): 1628.85 ng/mL (FEU) — ABNORMAL HIGH (ref 0.00–499.00)

## 2019-08-07 LAB — TROPONIN I (HIGH SENSITIVITY)
Troponin I (High Sensitivity): 9 ng/L (ref <18)
Troponin I (High Sensitivity): 9 ng/L (ref <18)

## 2019-08-07 LAB — FERRITIN: Ferritin: 195 ng/mL (ref 24–336)

## 2019-08-07 LAB — HEMOGLOBIN A1C
Hgb A1c MFr Bld: 7 % — ABNORMAL HIGH (ref 4.8–5.6)
Mean Plasma Glucose: 154.2 mg/dL

## 2019-08-07 LAB — C-REACTIVE PROTEIN: CRP: 8.4 mg/dL — ABNORMAL HIGH (ref <1.0)

## 2019-08-07 LAB — LACTATE DEHYDROGENASE: LDH: 143 U/L (ref 98–192)

## 2019-08-07 MED ORDER — ASPIRIN EC 81 MG PO TBEC
81.0000 mg | DELAYED_RELEASE_TABLET | Freq: Every day | ORAL | Status: DC
  Administered 2019-08-07 – 2019-08-10 (×4): 81 mg via ORAL
  Filled 2019-08-07 (×5): qty 1

## 2019-08-07 MED ORDER — SODIUM CHLORIDE 0.9 % IV SOLN
2.0000 g | INTRAVENOUS | Status: DC
  Administered 2019-08-07: 2 g via INTRAVENOUS
  Filled 2019-08-07 (×2): qty 20

## 2019-08-07 MED ORDER — ENOXAPARIN SODIUM 40 MG/0.4ML ~~LOC~~ SOLN
40.0000 mg | SUBCUTANEOUS | Status: DC
  Filled 2019-08-07: qty 0.4

## 2019-08-07 MED ORDER — ONDANSETRON HCL 4 MG PO TABS
4.0000 mg | ORAL_TABLET | Freq: Four times a day (QID) | ORAL | Status: DC | PRN
  Filled 2019-08-07: qty 1

## 2019-08-07 MED ORDER — ACETAMINOPHEN 650 MG RE SUPP: 650.0000 mg | Freq: Four times a day (QID) | RECTAL | Status: DC | PRN

## 2019-08-07 MED ORDER — IOHEXOL 350 MG/ML SOLN
100.0000 mL | Freq: Once | INTRAVENOUS | Status: AC | PRN
  Administered 2019-08-07: 100 mL via INTRAVENOUS
  Filled 2019-08-07: qty 100

## 2019-08-07 MED ORDER — HYDROCOD POLST-CPM POLST ER 10-8 MG/5ML PO SUER: 5.0000 mL | Freq: Two times a day (BID) | ORAL | Status: DC | PRN

## 2019-08-07 MED ORDER — ENOXAPARIN SODIUM 40 MG/0.4ML ~~LOC~~ SOLN: 40.0000 mg | Freq: Once | SUBCUTANEOUS | Status: DC

## 2019-08-07 MED ORDER — IPRATROPIUM-ALBUTEROL 0.5-2.5 (3) MG/3ML IN SOLN: 3.0000 mL | Freq: Four times a day (QID) | RESPIRATORY_TRACT | Status: DC | PRN

## 2019-08-07 MED ORDER — ENOXAPARIN SODIUM 40 MG/0.4ML ~~LOC~~ SOLN
40.0000 mg | Freq: Two times a day (BID) | SUBCUTANEOUS | Status: DC
  Administered 2019-08-07 – 2019-08-10 (×6): 40 mg via SUBCUTANEOUS
  Filled 2019-08-07 (×7): qty 0.4

## 2019-08-07 MED ORDER — ASPIRIN EC 81 MG PO TBEC: 81.0000 mg | DELAYED_RELEASE_TABLET | Freq: Four times a day (QID) | ORAL | Status: DC | PRN

## 2019-08-07 MED ORDER — ONDANSETRON HCL 4 MG/2ML IJ SOLN: 4.0000 mg | Freq: Four times a day (QID) | INTRAMUSCULAR | Status: DC | PRN

## 2019-08-07 MED ORDER — GUAIFENESIN ER 600 MG PO TB12
600.0000 mg | ORAL_TABLET | Freq: Two times a day (BID) | ORAL | Status: DC
  Administered 2019-08-07 – 2019-08-10 (×8): 600 mg via ORAL
  Filled 2019-08-07 (×12): qty 1

## 2019-08-07 MED ORDER — SODIUM CHLORIDE 0.9 % IV SOLN
INTRAVENOUS | Status: DC
  Administered 2019-08-07 – 2019-08-10 (×6): via INTRAVENOUS

## 2019-08-07 MED ORDER — SODIUM CHLORIDE 0.9 % IV SOLN
500.0000 mg | INTRAVENOUS | Status: DC
  Administered 2019-08-07 – 2019-08-10 (×4): 500 mg via INTRAVENOUS
  Filled 2019-08-07 (×4): qty 500

## 2019-08-07 MED ORDER — INSULIN ASPART 100 UNIT/ML ~~LOC~~ SOLN
0.0000 [IU] | Freq: Three times a day (TID) | SUBCUTANEOUS | Status: DC
  Administered 2019-08-07: 2 [IU] via SUBCUTANEOUS
  Administered 2019-08-08 – 2019-08-09 (×4): 1 [IU] via SUBCUTANEOUS
  Filled 2019-08-07 (×4): qty 1

## 2019-08-07 MED ORDER — ACETAMINOPHEN 325 MG PO TABS
650.0000 mg | ORAL_TABLET | Freq: Four times a day (QID) | ORAL | Status: DC | PRN
  Administered 2019-08-07 – 2019-08-09 (×6): 650 mg via ORAL
  Filled 2019-08-07 (×7): qty 2

## 2019-08-07 MED ORDER — SODIUM CHLORIDE 0.9 % IV SOLN
1.0000 g | Freq: Three times a day (TID) | INTRAVENOUS | Status: DC
  Administered 2019-08-07 – 2019-08-10 (×9): 1 g via INTRAVENOUS
  Filled 2019-08-07 (×12): qty 1

## 2019-08-07 MED ORDER — TRAZODONE HCL 50 MG PO TABS
25.0000 mg | ORAL_TABLET | Freq: Every evening | ORAL | Status: DC | PRN
  Filled 2019-08-07: qty 0.5

## 2019-08-07 MED ORDER — FLUTICASONE PROPIONATE 50 MCG/ACT NA SUSP
2.0000 | Freq: Every day | NASAL | Status: DC
  Administered 2019-08-08 – 2019-08-10 (×3): 2 via NASAL
  Filled 2019-08-07 (×2): qty 16

## 2019-08-07 MED ORDER — ASPIRIN-ACETAMINOPHEN-CAFFEINE 250-250-65 MG PO TABS
2.0000 | ORAL_TABLET | Freq: Four times a day (QID) | ORAL | Status: DC | PRN
  Administered 2019-08-08: 2 via ORAL
  Filled 2019-08-07 (×4): qty 2

## 2019-08-07 MED ORDER — NICOTINE 21 MG/24HR TD PT24
21.0000 mg | MEDICATED_PATCH | Freq: Every day | TRANSDERMAL | Status: DC
  Administered 2019-08-07 – 2019-08-10 (×4): 21 mg via TRANSDERMAL
  Filled 2019-08-07 (×4): qty 1

## 2019-08-07 MED ORDER — MAGNESIUM HYDROXIDE 400 MG/5ML PO SUSP
30.0000 mL | Freq: Every day | ORAL | Status: DC | PRN
  Filled 2019-08-07: qty 30

## 2019-08-07 MED ORDER — HYDRALAZINE HCL 20 MG/ML IJ SOLN
10.0000 mg | Freq: Four times a day (QID) | INTRAMUSCULAR | Status: DC | PRN
  Administered 2019-08-07: 10 mg via INTRAVENOUS
  Filled 2019-08-07: qty 1

## 2019-08-07 MED ORDER — IRBESARTAN 150 MG PO TABS
75.0000 mg | ORAL_TABLET | Freq: Every day | ORAL | Status: DC
  Administered 2019-08-07 – 2019-08-10 (×4): 75 mg via ORAL
  Filled 2019-08-07 (×4): qty 1

## 2019-08-07 NOTE — H&P (Addendum)
[place] at [facility]   PATIENT NAME: Jerome Cunningham    MR#:  [id number]  DATE OF BIRTH:  1955-10-11  DATE OF ADMISSION:  08/06/2019  PRIMARY CARE PHYSICIAN: Marisue Ivan, MD   REQUESTING/REFERRING PHYSICIAN: Pia Mau, PA-C Bayard Males, MD CHIEF COMPLAINT:   Chief Complaint  Patient presents with  . Fever    HISTORY OF PRESENT ILLNESS:  Jerome Cunningham  is a 63 y.o. Caucasian male with a known history of hypertension, obstructive sleep apnea on home CPAP,  type 2 diabetes mellitus, mild COPD and osteoarthritis, who presented to emergency room with onset of high fever up to 101.9 at 3 PM today with associated chills.  A couple of days ago he had diarrhea with loose bowel movements that has resolved.  He denied any nausea or vomiting.  He denied any loss of taste or smell.  He has been having consistent cough lately which has been mainly dry but sounds congested.  He admits to mild sore throat without ear ache.  He admits to mild rhinorrhea with no significant nasal congestion.  He gets dyspnea on exertion only climbing stairs or walking fast.  No dysuria, oliguria or hematuria or flank pain.  No hemoptysis.  No leg pain or edema, recent travels or surgeries.  Upon presentation to the emergency room, temperature was 103.1 with a blood pressure 141/83, heart rate of 118 and respiratory to 20 with pulse currently 94% on room air and later 96%.  Labs revealed cytosis of 14.1 with neutrophilia and ANC/ALC ratio of 12.4/1 with a lactic acid of 1.7.  COVID-19 test is currently pending.  UA showed 21-50 RBCs with 0-5 WBCs and negative nitrites with no bacteria.  Blood cultures were sent.  Portable chest x-ray showed no acute cardiopulmonary disease.  The patient was given 650 mg p.o. Tylenol.  He will be admitted to medical monitored bed for further evaluation and management.  PAST MEDICAL HISTORY:   Past Medical History:  Diagnosis Date  . Arthritis    hands,  knees and hips  . Diabetes mellitus    diet control  . Diverticulitis   . Hypertension   . Sleep apnea    uses CPAP  -Migraine headaches -Mild COPD -Ongoing tobacco abuse PAST SURGICAL HISTORY:   Past Surgical History:  Procedure Laterality Date  . COLONOSCOPY WITH PROPOFOL N/A 03/12/2018   Procedure: COLONOSCOPY WITH PROPOFOL;  Surgeon: Toledo, Boykin Nearing, MD;  Location: [facility] ENDOSCOPY;  Service: Gastroenterology;  Laterality: N/A;  . HIP SURGERY    . HYDROCELE EXCISION Left 04/08/2019   Procedure: HYDROCELECTOMY ADULT;  Surgeon: Riki Altes, MD;  Location: [facility] ORS;  Service: Urology;  Laterality: Left;  . JOINT REPLACEMENT Right 02/14/2016  . NASAL SEPTUM SURGERY    . TONSILLECTOMY    . TOTAL HIP ARTHROPLASTY Right 02/14/2016   Procedure: TOTAL HIP ARTHROPLASTY ANTERIOR APPROACH;  Surgeon: Kennedy Bucker, MD;  Location: [facility] ORS;  Service: Orthopedics;  Laterality: Right;    SOCIAL HISTORY:   Social History   Tobacco Use  . Smoking status: Current Every Day Smoker    Packs/day: 1.00    Years: 40.00    Pack years: 40.00  . Smokeless tobacco: Never Used  Substance Use Topics  . Alcohol use: Not Currently    FAMILY HISTORY:   Family History  Problem Relation Age of Onset  . Kidney disease Paternal Grandfather   . Prostate cancer Neg Hx   . Bladder Cancer Neg Hx   .  Kidney cancer Neg Hx     DRUG ALLERGIES:   Allergies  Allergen Reactions  . Tramadol Other (See Comments)    "increases pain"    REVIEW OF SYSTEMS:   ROS As per history of present illness. All pertinent systems were reviewed above. Constitutional,  HEENT, cardiovascular, respiratory, GI, GU, musculoskeletal, neuro, psychiatric, endocrine,  integumentary and hematologic systems were reviewed and are otherwise  negative/unremarkable except for positive findings mentioned above in the HPI.   MEDICATIONS AT HOME:   Prior to Admission medications   Medication Sig Start Date End Date Taking?  Authorizing Provider  acetaminophen (TYLENOL) 500 MG tablet Take 500 mg by mouth every 6 (six) hours as needed for moderate pain.     [provider]  aspirin EC 81 MG tablet Take 81 mg by mouth every 6 (six) hours as needed for moderate pain.     [provider]  aspirin-acetaminophen-caffeine (EXCEDRIN MIGRAINE) [phone] MG per tablet Take 2 tablets by mouth every 6 (six) hours as needed for headache.     [provider]  glucose blood (PRECISION QID TEST) test strip Use 1 strip once daily Use as instructed. Checks blood sugar occasionally ACCU-CHEK    [provider]  metFORMIN (GLUCOPHAGE) 500 MG tablet Take 500 mg by mouth 2 (two) times daily with a meal.  09/11/17   [provider]  telmisartan (MICARDIS) 80 MG tablet Take 80 mg by mouth at bedtime.     [provider]      VITAL SIGNS:  Blood pressure 121/69, pulse 93, temperature 98.3 F (36.8 C), temperature source Oral, resp. rate 18, height 6\' 1"  (1.854 m), weight (!) 165.6 kg, SpO2 96 %.  PHYSICAL EXAMINATION:  Physical Exam  GENERAL:  63 y.o.-year-old obese Caucasian male patient lying in the bed with no acute distress with occasional congested cough.  EYES: Pupils equal, round, reactive to light and accommodation. No scleral icterus. Extraocular muscles intact.  HEENT: Head atraumatic, normocephalic. Oropharynx and nasopharynx clear.  NECK:  Supple, no jugular venous distention. No thyroid enlargement, no tenderness.  LUNGS: Normal breath sounds bilaterally, no wheezing, rales,rhonchi or crepitation. No use of accessory muscles of respiration.  CARDIOVASCULAR: Regular rate and rhythm, S1, S2 normal. No murmurs, rubs, or gallops.  ABDOMEN: Soft, nondistended, nontender. Bowel sounds present. No organomegaly or mass.  EXTREMITIES: No pedal edema, cyanosis, or clubbing.  NEUROLOGIC: Cranial nerves II through XII are intact. Muscle strength 5/5 in all extremities. Sensation  intact. Gait not checked.  PSYCHIATRIC: The patient is alert and oriented x 3.  Normal affect and good eye contact. SKIN: No obvious rash, lesion, or ulcer.   LABORATORY PANEL:   CBC Recent Labs  Lab 08/06/19 2259  WBC 14.1*  HGB 13.5  HCT 40.9  PLT 264   ------------------------------------------------------------------------------------------------------------------  Chemistries  Recent Labs  Lab 08/06/19 2259  NA 136  K 3.9  CL 104  CO2 22  GLUCOSE 109*  BUN 17  CREATININE 0.83  CALCIUM 9.3  AST 31  ALT 32  ALKPHOS 63  BILITOT 0.6   ------------------------------------------------------------------------------------------------------------------  Cardiac Enzymes No results for input(s): TROPONINI in the last 168 hours. ------------------------------------------------------------------------------------------------------------------  RADIOLOGY:  Dg Chest 1 View  Result Date: 08/06/2019 CLINICAL DATA:  Fevers and chills for 1 day EXAM: CHEST  1 VIEW COMPARISON:  05/02/2018 FINDINGS: Cardiac shadows within normal limits. The lungs are well aerated bilaterally. No focal infiltrate or sizable effusion is seen. No bony abnormality is noted. IMPRESSION: No  active disease. Electronically Signed   By: Alcide CleverMark  Lukens M.D.   On: 08/06/2019 22:36      IMPRESSION AND PLAN:   1.  SIRS as evidenced by fever, tachycardia and leukocytosis with associated cough fairly suspicious for sepsis due to possibly clinical community-acquired pneumonia with differential diagnoses including bacterial or atypical viral developing pneumonia.  The patient could be having bacteremia of unclear source since his chest x-ray is not clearly showing pneumonia. -He will be admitted to a medical monitored bed. -We will obtain inflammatory markers awaiting COVID-19 test results. -We will empirically cover with IV Rocephin and Zithromax which can be deescalated later on.  We will hydrate with IV normal  saline. -Sputum and blood cultures will be followed.  Mucolytic therapy will be provided. -We will add Flonase nasal spray for possible postnasal drip contributing to his irritant cough. -I will check a chest CTA for further assessment especially given reported hypoxemia..  2.  Type 2 diabetes mellitus.  We will place the patient on supplemental coverage with NovoLog and hold off Metformin.  3.  Hypertension.  We will continue ARB therapy.  4.  Ongoing tobacco abuse.  He was counseled for smoking cessation and will receive further counseling here.  5.  Obstructive sleep apnea.  He will be continued on his CPAP nightly.  6.  Mild COPD.  He is not currently using any inhalers.  7.  DVT prophylaxis.  Subcutaneous Lovenox.   All the records are reviewed and case discussed with ED provider. The plan of care was discussed in details with the patient (and family). I answered all questions. The patient agreed to proceed with the above mentioned plan. Further management will depend upon hospital course.   CODE STATUS: Full code  TOTAL TIME TAKING CARE OF THIS PATIENT: 55 minutes.    Hannah BeatJan A  M.D on 08/07/2019 at 12:36 AM  [facility]   From 7 PM-7 AM, contact night-coverage [email or web address]  CC: Primary care physician; Marisue IvanLinthavong, [place], MD   Note: This dictation was prepared with Dragon dictation along with smaller phrase technology. Any transcriptional errors that result from this process are unintentional.

## 2019-08-07 NOTE — ED Notes (Signed)
Pt given breakfast tray

## 2019-08-07 NOTE — Progress Notes (Signed)
PHARMACY - PHYSICIAN COMMUNICATION CRITICAL VALUE ALERT - BLOOD CULTURE IDENTIFICATION (BCID)  Jerome Cunningham is an 63 y.o. male who presented to [facility] on 08/06/2019  Assessment:  1/4 bottles E.coli  Name of physician (or Provider) Contacted: Dr. Clementeen Graham  Current antibiotics: Rocephin, azithromycin  Changes to prescribed antibiotics recommended: Rocephin to meropenem pending sensitivities  Results for orders placed or performed during the hospital encounter of 08/06/19  Blood Culture ID Panel (Reflexed) (Collected: 08/06/2019 10:59 PM)  Result Value Ref Range   Enterococcus species NOT DETECTED NOT DETECTED   Listeria monocytogenes NOT DETECTED NOT DETECTED   Staphylococcus species NOT DETECTED NOT DETECTED   Staphylococcus aureus (BCID) NOT DETECTED NOT DETECTED   Streptococcus species NOT DETECTED NOT DETECTED   Streptococcus agalactiae NOT DETECTED NOT DETECTED   Streptococcus pneumoniae NOT DETECTED NOT DETECTED   Streptococcus pyogenes NOT DETECTED NOT DETECTED   Acinetobacter baumannii NOT DETECTED NOT DETECTED   Enterobacteriaceae species DETECTED (A) NOT DETECTED   Enterobacter cloacae complex NOT DETECTED NOT DETECTED   Escherichia coli DETECTED (A) NOT DETECTED   Klebsiella oxytoca NOT DETECTED NOT DETECTED   Klebsiella pneumoniae NOT DETECTED NOT DETECTED   Proteus species NOT DETECTED NOT DETECTED   Serratia marcescens NOT DETECTED NOT DETECTED   Carbapenem resistance NOT DETECTED NOT DETECTED   Haemophilus influenzae NOT DETECTED NOT DETECTED   Neisseria meningitidis NOT DETECTED NOT DETECTED   Pseudomonas aeruginosa NOT DETECTED NOT DETECTED   Candida albicans NOT DETECTED NOT DETECTED   Candida glabrata NOT DETECTED NOT DETECTED   Candida krusei NOT DETECTED NOT DETECTED   Candida parapsilosis NOT DETECTED NOT DETECTED   Candida tropicalis NOT DETECTED NOT DETECTED    Tawnya Crook, PharmD 08/07/2019  4:15 PM

## 2019-08-07 NOTE — Progress Notes (Signed)
PHARMACIST - PHYSICIAN COMMUNICATION  CONCERNING:  Enoxaparin (Lovenox) for DVT Prophylaxis    RECOMMENDATION: Patient was prescribed enoxaprin 40mg  q24 hours for VTE prophylaxis.   Filed Weights   08/06/19 1945  Weight: (!) 365 lb (165.6 kg)    Body mass index is 48.16 kg/m.  Estimated Creatinine Clearance: 140.8 mL/min (by C-G formula based on SCr of 0.89 mg/dL).   Based on [place] patient is candidate for enoxaparin 40mg  every 12 hour dosing due to BMI being >40.  DESCRIPTION: Pharmacy has adjusted enoxaparin dose per [facility] policy.  Patient is now receiving enoxaparin 40mg  every 12 hours.   D-dimer also elevated to 1628 ng/mL. COVID pending. CTA negative impression for PE.   [place] Resident 08/07/2019 11:57 AM

## 2019-08-07 NOTE — Progress Notes (Signed)
PROGRESS NOTE                                                                                                                                                                                                             Patient Demographics:    Jerome Cunningham, is a 63 y.o. male, DOB - 07-Apr-1956, NKN:[id number]  Admit date - 08/06/2019   Admitting Physician No admitting provider for patient encounter.  Outpatient Primary MD for the patient is Dion Body, MD  LOS - 0  Outpatient Specialists: None  Chief Complaint  Patient presents with  . Fever       Brief Narrative   63 year old morbidly obese male with history of hypertension, OSA, on CPAP, type 2 diabetes mellitus, mild COPD but not on home O2 and osteoarthritis presented with fever of 101.9 F associated with chills.  Few days back she had diarrhea and loose bowel movements that has resolved.  Denied nausea vomiting, chest pain, abdominal pain, loss of smell or taste or dysuria.  Reported having cough which is nonproductive but feels congested.  Denies any sick contact or exposure to COVID-19.  No recent travel.  Reports being dyspneic to minimal exertion and having nasal congestion. In the ED he was septic with fever of 103 F, tachycardic to 118 and satting 94% on room air.  Blood work showed WC of 14 K, lactic acid of 1.7.  Chest x-ray without any infiltrate.  Ferritin was normal.  D-dimer elevated at 1600, normal LDH and troponin. Patient given IV fluids, empiric Rocephin and azithromycin and admitted to hospital service.  COVID-19 later came back as negative.  Subjective:   Obese male in NAD, on RA, smokes 1 PPD   Assessment  & Plan :   Principal problem Severe sepsis ([place]) Blood culture 1/4 growing ESBL.  Flu PCR negative.  COVID-19 negative.  CT angiogram of the chest negative for PE or any infiltrate.  Antibiotic switched to IV meropenem.  Source  unclear.  Will obtain 2D echo to rule out vegetation. Supportive care with Tylenol.  Oxygen as needed.  As needed nebs follow blood and urine culture.  (UA was unremarkable) Monitor on telemetry.   Type 2 diabetes mellitus, controlled A1c of 7.  Monitor on sliding scale coverage.  Holding Metformin  OSA on CPAP Resume  Essential hypertension Resume home  meds  Ongoing tobacco use with mild COPD Smokes 1 pack/day.  Counseled strongly on cessation.  Nicotine patch.  Add as needed nebs    DVT prophylaxis: Subcu Lovenox     Code Status : Full code  Family Communication  : None  Disposition Plan  : Home pending clinical improvement  Barriers For Discharge : Active symptoms  Consults  : None  Procedures  : CT angiogram of the chest, 2D echo  Lab Results  Component Value Date   PLT 231 08/07/2019    Antibiotics  :  Anti-infectives (From admission, onward)   Start     Dose/Rate Route Frequency Ordered Stop   08/07/19 0130  azithromycin (ZITHROMAX) 500 mg in sodium chloride 0.9 % 250 mL IVPB     500 mg 250 mL/hr over 60 Minutes Intravenous Every 24 hours 08/07/19 0044 08/12/19 0129   08/07/19 0100  cefTRIAXone (ROCEPHIN) 2 g in sodium chloride 0.9 % 100 mL IVPB     2 g 200 mL/hr over 30 Minutes Intravenous Every 24 hours 08/07/19 0044 08/12/19 0059        Objective:   Vitals:   08/06/19 2134 08/06/19 2351 08/07/19 0421 08/07/19 0743  BP:  121/69 (!) 155/74 121/77  Pulse: 100 93 98 94  Resp:  18 18 19   Temp: 100 F (37.8 C) 98.3 F (36.8 C) (!) 101.1 F (38.4 C) 99.5 F (37.5 C)  TempSrc: Oral Oral Oral Oral  SpO2: 94% 96% 93% 94%  Weight:      Height:        Wt Readings from Last 3 Encounters:  08/06/19 (!) 165.6 kg  06/26/19 (!) 169.6 kg  05/22/19 (!) 169.6 kg     Intake/Output Summary (Last 24 hours) at 08/07/2019 0902 Last data filed at 08/07/2019 0520 Gross per 24 hour  Intake 490 ml  Output -  Net 490 ml     Physical Exam  Gen: Obese  male not in distress HEENT: no pallor, moist mucosa, supple neck Chest: Diffuse wheezing b/l CVS: N S1&S2, no murmurs, rubs or gallop GI: soft, NT, ND, BS+ Musculoskeletal: warm, no edema CNS: AAOX3, non focal    Data Review:    CBC Recent Labs  Lab 08/06/19 2259 08/07/19 0512  WBC 14.1* 10.9*  HGB 13.5 13.1  HCT 40.9 39.6  PLT 264 231  MCV 87.0 86.3  MCH 28.7 28.5  MCHC 33.0 33.1  RDW 13.7 13.9  LYMPHSABS 1.0  --   MONOABS 0.5  --   EOSABS 0.1  --   BASOSABS 0.1  --     Chemistries  Recent Labs  Lab 08/06/19 2259 08/07/19 0512  NA 136 136  K 3.9 4.1  CL 104 103  CO2 22 23  GLUCOSE 109* 135*  BUN 17 15  CREATININE 0.83 0.89  CALCIUM 9.3 8.8*  AST 31  --   ALT 32  --   ALKPHOS 63  --   BILITOT 0.6  --    ------------------------------------------------------------------------------------------------------------------ No results for input(s): CHOL, HDL, LDLCALC, TRIG, CHOLHDL, LDLDIRECT in the last 72 hours.  Lab Results  Component Value Date   HGBA1C 7.0 (H) 08/07/2019   ------------------------------------------------------------------------------------------------------------------ No results for input(s): TSH, T4TOTAL, T3FREE, THYROIDAB in the last 72 hours.  Invalid input(s): FREET3 ------------------------------------------------------------------------------------------------------------------ Recent Labs    08/07/19 0132  FERRITIN 195    Coagulation profile No results for input(s): INR, PROTIME in the last 168 hours.  No results for input(s): DDIMER in the last  72 hours.  Cardiac Enzymes No results for input(s): CKMB, TROPONINI, MYOGLOBIN in the last 168 hours.  Invalid input(s): CK ------------------------------------------------------------------------------------------------------------------ No results found for: BNP  Inpatient Medications  Scheduled Meds: . aspirin EC  81 mg Oral Daily  . enoxaparin (LOVENOX) injection  40  mg Subcutaneous Q24H  . fluticasone  2 spray Each Nare Daily  . guaiFENesin  600 mg Oral BID  . insulin aspart  0-9 Units Subcutaneous TID WC  . irbesartan  75 mg Oral Daily   Continuous Infusions: . sodium chloride 100 mL/hr at 08/07/19 0113  . azithromycin Stopped (08/07/19 0520)  . cefTRIAXone (ROCEPHIN)  IV Stopped (08/07/19 0150)   PRN Meds:.acetaminophen **OR** acetaminophen, aspirin EC, aspirin-acetaminophen-caffeine, chlorpheniramine-HYDROcodone, magnesium hydroxide, ondansetron **OR** ondansetron (ZOFRAN) IV, traZODone  Micro Results Recent Results (from the past 240 hour(s))  Blood culture (routine x 2)     Status: None (Preliminary result)   Collection Time: 08/06/19 10:59 PM   Specimen: BLOOD  Result Value Ref Range Status   Specimen Description BLOOD BLOOD LEFT HAND  Final   Special Requests   Final    BOTTLES DRAWN AEROBIC AND ANAEROBIC Blood Culture adequate volume   Culture   Final    NO GROWTH < 12 HOURS Performed at [facility], [address] [place], [place] [address]    Report Status PENDING  Incomplete  Blood culture (routine x 2)     Status: None (Preliminary result)   Collection Time: 08/06/19 10:59 PM   Specimen: BLOOD  Result Value Ref Range Status   Specimen Description BLOOD BLOOD RIGHT HAND  Final   Special Requests   Final    BOTTLES DRAWN AEROBIC AND ANAEROBIC Blood Culture results may not be optimal due to an excessive volume of blood received in culture bottles   Culture   Final    NO GROWTH < 12 HOURS Performed at [facility], [address], [place], [place] [address]    Report Status PENDING  Incomplete    Radiology Reports Dg Chest 1 View  Result Date: 08/06/2019 CLINICAL DATA:  Fevers and chills for 1 day EXAM: CHEST  1 VIEW COMPARISON:  05/02/2018 FINDINGS: Cardiac shadows within normal limits. The lungs are well aerated bilaterally. No focal infiltrate or sizable effusion is seen. No bony abnormality is noted.  IMPRESSION: No active disease. Electronically Signed   By: Alcide Clever M.D.   On: 08/06/2019 22:36   Ct Angio Chest Pe W Or Wo Contrast  Result Date: 08/07/2019 CLINICAL DATA:  Shortness of breath, fever EXAM: CT ANGIOGRAPHY CHEST WITH CONTRAST TECHNIQUE: Multidetector CT imaging of the chest was performed using the standard protocol during bolus administration of intravenous contrast. Multiplanar CT image reconstructions and MIPs were obtained to evaluate the vascular anatomy. CONTRAST:  OMNIPAQUE IOHEXOL 350 MG/ML SOLN COMPARISON:  May 02, 2018 FINDINGS: Cardiovascular: There is a optimal opacification of the pulmonary arteries. There is no central,segmental, or subsegmental filling defects within the pulmonary arteries. The heart is normal in size. No pericardial effusion or thickening. No evidence right heart strain. There is normal three-vessel brachiocephalic anatomy without proximal stenosis. The thoracic aorta is normal in appearance. Minimal coronary artery calcifications seen. Mediastinum/Nodes: No hilar, mediastinal, or axillary adenopathy. Thyroid gland, trachea, and esophagus demonstrate no significant findings. Lungs/Pleura: The lungs are clear. No pleural effusion or pneumothorax. No airspace consolidation. Upper Abdomen: No acute abnormalities present in the visualized portions of the upper abdomen. Musculoskeletal: No chest wall abnormality. No acute  or significant osseous findings. Degenerative changes seen in the thoracolumbar spine. Review of the MIP images confirms the above findings. IMPRESSION: No central, segmental, or subsegmental pulmonary embolism. No acute intrathoracic pathology to explain the patient's symptoms. Electronically Signed   By: Jonna Clark M.D.   On: 08/07/2019 02:08    Time Spent in minutes  35   Jaima Janney M.D on 08/07/2019 at 9:02 AM  Between 7am to 7pm - Pager - [phone]  After 7pm go to [email or web address] - password [facility]  [facility] -  Office  [phone]

## 2019-08-07 NOTE — Plan of Care (Signed)

## 2019-08-07 NOTE — ED Notes (Signed)
Pt on way to floor.  Called 1 C and let secretary know pt coming and RN can answer questions if needed when get there.

## 2019-08-08 ENCOUNTER — Encounter: Payer: Self-pay | Admitting: Internal Medicine

## 2019-08-08 ENCOUNTER — Inpatient Hospital Stay
Admit: 2019-08-08 | Discharge: 2019-08-08 | Disposition: A | Discharge status: Home / Self Care | Attending: Internal Medicine | Admitting: Internal Medicine

## 2019-08-08 DIAGNOSIS — G4733 Obstructive sleep apnea (adult) (pediatric): Secondary | ICD-10-CM

## 2019-08-08 DIAGNOSIS — Z9989 Dependence on other enabling machines and devices: Secondary | ICD-10-CM

## 2019-08-08 LAB — ECHOCARDIOGRAM COMPLETE
Height: 73 in
Weight: 5840 oz

## 2019-08-08 LAB — URINE CULTURE
Culture: NO GROWTH
Special Requests: NORMAL

## 2019-08-08 LAB — GLUCOSE, CAPILLARY: Glucose-Capillary: 95 mg/dL (ref 70–99)

## 2019-08-08 MED ORDER — IBUPROFEN 400 MG PO TABS: 200.0000 mg | ORAL_TABLET | Freq: Four times a day (QID) | ORAL | Status: DC | PRN

## 2019-08-08 MED ORDER — SODIUM CHLORIDE 0.9 % IV BOLUS
1000.0000 mL | Freq: Once | INTRAVENOUS | Status: AC
  Administered 2019-08-08: 01:00:00 1000 mL via INTRAVENOUS

## 2019-08-08 NOTE — Progress Notes (Signed)
Progress Note    Jerome Cunningham  VQQ:[id number] DOB: [birth year]  DOA: 08/06/2019 PCP: Marisue Ivan, MD      Brief Narrative:    Medical records reviewed and are as summarized below:  Jerome Cunningham is an 63 y.o. male with medical history significant for morbid obesity, type II DM, hypertension, tobacco abuse, COPD, OSA, presented to the hospital because of fever, chills and mild sore throat.  He was admitted to the hospital for sepsis and blood culture grew E. coli.      Assessment/Plan:   Active Problems:   Diabetes mellitus without complication (HCC)   Hypertension, essential   OSA on CPAP   Fever   Severe sepsis (HCC)   ESBL (extended spectrum beta-lactamase) producing bacteria infection   Uncontrolled hypertension   Tobacco abuse   Body mass index is 48.16 kg/m.    E. coli sepsis and bacteremia: Follow-up blood culture and sensitivity report.  Continue IV meropenem for now.  2D echo pending to evaluate for endocarditis.  No clear source of bacteremia thus far.  Type 2 DM: NovoLog as needed for hyperglycemia.  Hypertension: Continue irbesartan  Mild COPD: Stable  Obstructive sleep apnea: Continue CPAP at night  Tobacco abuse: Patient has been advised to stop smoking cigarettes.  Left inguinal hernia: He has been evaluated by a surgeon in the past and watchful waiting was recommended.     Family Communication/Anticipated D/C date and plan/Code Status   DVT prophylaxis: Lovenox Code Status: Full code Family Communication: Plan discussed with the patient Disposition Plan: Home in 2 to 3 days      Subjective:   He had fever overnight but otherwise he feels better.  He says he usually has a chronic cough because of sinus drainage.  No shortness of breath or chest pain.  No urinary symptoms.  Objective:    Vitals:   08/08/19 0000 08/08/19 0100 08/08/19 0355 08/08/19 0811  BP:   128/75 124/76  Pulse:   76 80  Resp:    (!) 23 17  Temp: (!) 103.1 F (39.5 C) (!) 101.1 F (38.4 C) 98.9 F (37.2 C) 98.2 F (36.8 C)  TempSrc: Oral Oral Oral Oral  SpO2:   95% 99%  Weight:      Height:        Intake/Output Summary (Last 24 hours) at 08/08/2019 1314 Last data filed at 08/08/2019 1150 Gross per 24 hour  Intake 2831.14 ml  Output 2050 ml  Net 781.14 ml   Filed Weights   08/06/19 1945  Weight: (!) 165.6 kg    Exam:  GEN: NAD SKIN: No rash EYES: EOMI, no pallor or icterus ENT: MMM CV: RRR PULM: CTA B ABD: soft, obese, NT, +BS CNS: AAO x 3, non focal EXT: No edema or tenderness GU: Left inguinal hernia without tenderness   Data Reviewed:   I have personally reviewed following labs and imaging studies:  Labs: Labs show the following:   Basic Metabolic Panel: Recent Labs  Lab 08/06/19 2259 08/07/19 0512  NA 136 136  K 3.9 4.1  CL 104 103  CO2 22 23  GLUCOSE 109* 135*  BUN 17 15  CREATININE 0.83 0.89  CALCIUM 9.3 8.8*   GFR Estimated Creatinine Clearance: 140.8 mL/min (by C-G formula based on SCr of 0.89 mg/dL). Liver Function Tests: Recent Labs  Lab 08/06/19 2259  AST 31  ALT 32  ALKPHOS 63  BILITOT 0.6  PROT 7.4  ALBUMIN 4.0  No results for input(s): LIPASE, AMYLASE in the last 168 hours. No results for input(s): AMMONIA in the last 168 hours. Coagulation profile No results for input(s): INR, PROTIME in the last 168 hours.  CBC: Recent Labs  Lab 08/06/19 2259 08/07/19 0512  WBC 14.1* 10.9*  NEUTROABS 12.4*  --   HGB 13.5 13.1  HCT 40.9 39.6  MCV 87.0 86.3  PLT 264 231   Cardiac Enzymes: No results for input(s): CKTOTAL, CKMB, CKMBINDEX, TROPONINI in the last 168 hours. BNP (last 3 results) No results for input(s): PROBNP in the last 8760 hours. CBG: Recent Labs  Lab 08/07/19 0912 08/07/19 1123  GLUCAP 114* 164*   D-Dimer: No results for input(s): DDIMER in the last 72 hours. Hgb A1c: Recent Labs    08/07/19 0132  HGBA1C 7.0*   Lipid  Profile: No results for input(s): CHOL, HDL, LDLCALC, TRIG, CHOLHDL, LDLDIRECT in the last 72 hours. Thyroid function studies: No results for input(s): TSH, T4TOTAL, T3FREE, THYROIDAB in the last 72 hours.  Invalid input(s): FREET3 Anemia work up: Recent Labs    08/07/19 0132  FERRITIN 195   Sepsis Labs: Recent Labs  Lab 08/06/19 2259 08/07/19 0512  WBC 14.1* 10.9*  LATICACIDVEN 1.7  --     Microbiology Recent Results (from the past 240 hour(s))  Blood culture (routine x 2)     Status: Abnormal (Preliminary result)   Collection Time: 08/06/19 10:59 PM   Specimen: BLOOD  Result Value Ref Range Status   Specimen Description   Final    BLOOD BLOOD LEFT HAND Performed at [facility], [address] [place], [place] [address]    Special Requests   Final    BOTTLES DRAWN AEROBIC AND ANAEROBIC Blood Culture adequate volume Performed at [facility], [address] [place], [place] [address]    Culture  Setup Time   Final    GRAM NEGATIVE RODS AEROBIC BOTTLE ONLY CRITICAL RESULT CALLED TO, READ BACK BY AND VERIFIED WITH: [facility] NAZARI AT 1524 08/07/2019.PMF Performed at [facility], [address] [place], [place] [address]    Culture ESCHERICHIA COLI (A)  Final   Report Status PENDING  Incomplete  Blood culture (routine x 2)     Status: None (Preliminary result)   Collection Time: 08/06/19 10:59 PM   Specimen: BLOOD  Result Value Ref Range Status   Specimen Description BLOOD BLOOD RIGHT HAND  Final   Special Requests   Final    BOTTLES DRAWN AEROBIC AND ANAEROBIC Blood Culture results may not be optimal due to an excessive volume of blood received in culture bottles   Culture   Final    NO GROWTH 2 DAYS Performed at [facility], [address] [place], [place] [address]    Report Status PENDING  Incomplete  Urine culture     Status: None   Collection Time: 08/06/19 10:59 PM   Specimen: Urine, Random  Result Value Ref Range  Status   Specimen Description   Final    URINE, RANDOM Performed at [facility], [address] [place], [place] [address]    Special Requests   Final    Normal Performed at [facility], [address] [place], [place] [address]    Culture   Final    NO GROWTH Performed at [facility], [address] [place], [place] [address]    Report Status 08/08/2019 FINAL  Final  Blood Culture ID Panel (Reflexed)  Status: Abnormal   Collection Time: 08/06/19 10:59 PM  Result Value Ref Range Status   Enterococcus species NOT DETECTED NOT DETECTED Final   Listeria monocytogenes NOT DETECTED NOT DETECTED Final   Staphylococcus species NOT DETECTED NOT DETECTED Final   Staphylococcus aureus (BCID) NOT DETECTED NOT DETECTED Final   Streptococcus species NOT DETECTED NOT DETECTED Final   Streptococcus agalactiae NOT DETECTED NOT DETECTED Final   Streptococcus pneumoniae NOT DETECTED NOT DETECTED Final   Streptococcus pyogenes NOT DETECTED NOT DETECTED Final   Acinetobacter baumannii NOT DETECTED NOT DETECTED Final   Enterobacteriaceae species DETECTED (A) NOT DETECTED Final    Comment: Enterobacteriaceae represent a large family of gram-negative bacteria, not a single organism. CRITICAL RESULT CALLED TO, READ BACK BY AND VERIFIED WITH: Mila Merry AT 1524 08/07/2019.PMF    Enterobacter cloacae complex NOT DETECTED NOT DETECTED Final   Escherichia coli DETECTED (A) NOT DETECTED Final    Comment: CRITICAL RESULT CALLED TO, READ BACK BY AND VERIFIED WITH: WALID NAZARI AT 1524 08/07/2019.PMF    Klebsiella oxytoca NOT DETECTED NOT DETECTED Final   Klebsiella pneumoniae NOT DETECTED NOT DETECTED Final   Proteus species NOT DETECTED NOT DETECTED Final   Serratia marcescens NOT DETECTED NOT DETECTED Final   Carbapenem resistance NOT DETECTED NOT DETECTED Final   Haemophilus influenzae NOT DETECTED NOT DETECTED Final   Neisseria meningitidis NOT DETECTED NOT  DETECTED Final   Pseudomonas aeruginosa NOT DETECTED NOT DETECTED Final   Candida albicans NOT DETECTED NOT DETECTED Final   Candida glabrata NOT DETECTED NOT DETECTED Final   Candida krusei NOT DETECTED NOT DETECTED Final   Candida parapsilosis NOT DETECTED NOT DETECTED Final   Candida tropicalis NOT DETECTED NOT DETECTED Final    Comment: Performed at [facility], [address] [place], [place] [address]  SARS CORONAVIRUS 2 (TAT 6-24 HRS) Nasopharyngeal Nasopharyngeal Swab     Status: None   Collection Time: 08/06/19 11:13 PM   Specimen: Nasopharyngeal Swab  Result Value Ref Range Status   SARS Coronavirus 2 NEGATIVE NEGATIVE Final    Comment: (NOTE) SARS-CoV-2 target nucleic acids are NOT DETECTED. The SARS-CoV-2 RNA is generally detectable in upper and lower respiratory specimens during the acute phase of infection. Negative results do not preclude SARS-CoV-2 infection, do not rule out co-infections with other pathogens, and should not be used as the sole basis for treatment or other patient management decisions. Negative results must be combined with clinical observations, patient history, and epidemiological information. The expected result is Negative. Fact Sheet for Patients: HairSlick.no Fact Sheet for Healthcare Providers: quierodirigir.com This test is not yet approved or cleared by the [place] FDA and  has been authorized for detection and/or diagnosis of SARS-CoV-2 by FDA under an Emergency Use Authorization (EUA). This EUA will remain  in effect (meaning this test can be used) for the duration of the COVID-19 declaration under Section 56 4(b)(1) of the Act, 21 U.S.C. section 360bbb-3(b)(1), unless the authorization is terminated or revoked sooner. Performed at [facility], [address] [place], [place] [address]   Group A Strep by PCR [facility] Only)     Status: None   Collection Time:  08/07/19  3:55 PM   Specimen: Throat; Sterile Swab  Result Value Ref Range Status   Group A Strep by PCR NOT DETECTED NOT DETECTED Final    Comment: Performed at [facility], [address], [place], [place] [address]    Procedures and diagnostic studies:  Dg  Chest 1 View  Result Date: 08/06/2019 CLINICAL DATA:  Fevers and chills for 1 day EXAM: CHEST  1 VIEW COMPARISON:  05/02/2018 FINDINGS: Cardiac shadows within normal limits. The lungs are well aerated bilaterally. No focal infiltrate or sizable effusion is seen. No bony abnormality is noted. IMPRESSION: No active disease. Electronically Signed   By: Inez Catalina M.D.   On: 08/06/2019 22:36   Ct Angio Chest Pe W Or Wo Contrast  Result Date: 08/07/2019 CLINICAL DATA:  Shortness of breath, fever EXAM: CT ANGIOGRAPHY CHEST WITH CONTRAST TECHNIQUE: Multidetector CT imaging of the chest was performed using the standard protocol during bolus administration of intravenous contrast. Multiplanar CT image reconstructions and MIPs were obtained to evaluate the vascular anatomy. CONTRAST:  128mL OMNIPAQUE IOHEXOL 350 MG/ML SOLN COMPARISON:  May 02, 2018 FINDINGS: Cardiovascular: There is a optimal opacification of the pulmonary arteries. There is no central,segmental, or subsegmental filling defects within the pulmonary arteries. The heart is normal in size. No pericardial effusion or thickening. No evidence right heart strain. There is normal three-vessel brachiocephalic anatomy without proximal stenosis. The thoracic aorta is normal in appearance. Minimal coronary artery calcifications seen. Mediastinum/Nodes: No hilar, mediastinal, or axillary adenopathy. Thyroid gland, trachea, and esophagus demonstrate no significant findings. Lungs/Pleura: The lungs are clear. No pleural effusion or pneumothorax. No airspace consolidation. Upper Abdomen: No acute abnormalities present in the visualized portions of the upper abdomen. Musculoskeletal: No  chest wall abnormality. No acute or significant osseous findings. Degenerative changes seen in the thoracolumbar spine. Review of the MIP images confirms the above findings. IMPRESSION: No central, segmental, or subsegmental pulmonary embolism. No acute intrathoracic pathology to explain the patient's symptoms. Electronically Signed   By: Prudencio Pair M.D.   On: 08/07/2019 02:08    Medications:    aspirin EC  81 mg Oral Daily   enoxaparin (LOVENOX) injection  40 mg Subcutaneous Q12H   fluticasone  2 spray Each Nare Daily   guaiFENesin  600 mg Oral BID   insulin aspart  0-9 Units Subcutaneous TID WC   irbesartan  75 mg Oral Daily   nicotine  21 mg Transdermal Daily   Continuous Infusions:  sodium chloride 100 mL/hr at 08/08/19 0300   azithromycin Stopped (08/08/19 0104)   meropenem (MERREM) IV 1 g (08/08/19 0526)     LOS: 1 day   Amberia Bayless  [facility] Pager [phone].   *Please refer to [email or web address], password [email or web address] to get updated schedule on who will round on this patient, as hospitalists switch teams weekly. If 7PM-7AM, please contact night-coverage at [email or web address], password [email or web address] for any overnight needs.  08/08/2019, 1:14 PM

## 2019-08-08 NOTE — Progress Notes (Signed)
Patient's oral temp 103.1, administered 650mg  of tylenol. Received order from Rufina Falco NP for 1L bolus of NS and ibuprofen. Will continue to monitor.

## 2019-08-09 LAB — LEGIONELLA PNEUMOPHILA SEROGP 1 UR AG: L. pneumophila Serogp 1 Ur Ag: NEGATIVE

## 2019-08-09 LAB — GLUCOSE, CAPILLARY
Glucose-Capillary: 110 mg/dL — ABNORMAL HIGH (ref 70–99)
Glucose-Capillary: 100 mg/dL — ABNORMAL HIGH (ref 70–99)
Glucose-Capillary: 140 mg/dL — ABNORMAL HIGH (ref 70–99)
Glucose-Capillary: 127 mg/dL — ABNORMAL HIGH (ref 70–99)

## 2019-08-09 NOTE — Progress Notes (Addendum)
Progress Note    Jerome Cunningham  ZOX:[id number] DOB: 02/07/1956  DOA: 08/06/2019 PCP: Marisue Ivan, MD      Brief Narrative:    Medical records reviewed and are as summarized below:  Sender Jerome Cunningham is an 63 y.o. male with medical history significant for morbid obesity, type II DM, hypertension, tobacco abuse, COPD, OSA, presented to the hospital because of fever, chills and mild sore throat.  He was admitted to the hospital for sepsis and blood culture grew E. coli.      Assessment/Plan:   Active Problems:   Diabetes mellitus without complication (HCC)   Hypertension, essential   OSA on CPAP   Fever   Severe sepsis (HCC)   ESBL (extended spectrum beta-lactamase) producing bacteria infection   Uncontrolled hypertension   Tobacco abuse   Body mass index is 48.16 kg/m.    E. coli sepsis and bacteremia: Blood culture sensitivity report is still pending.  Continue IV meropenem.  No evidence of vegetations on 2D echo.  EF estimated at 60 to 65%.  Repeat blood culture  Type 2 DM: NovoLog as needed for hyperglycemia.  Hypertension: BP is okay on irbesartan.  Mild COPD: No acute issues.  Obstructive sleep apnea: Continue CPAP at night  Tobacco abuse: Patient has been advised to stop smoking cigarettes.  Left inguinal hernia: He has been evaluated by a surgeon in the past and watchful waiting was recommended.     Family Communication/Anticipated D/C date and plan/Code Status   DVT prophylaxis: Lovenox Code Status: Full code Family Communication: Plan discussed with the patient Disposition Plan: Home in 1 to 2 days.      Subjective:   No new complaints.  No cough, shortness of breath or chest pain.  Objective:    Vitals:   08/08/19 0811 08/08/19 1634 08/08/19 1920 08/09/19 0508  BP: 124/76 136/75 116/62 135/79  Pulse: 80 82 86 86  Resp: Temp: 98.2 F (36.8 C) 99 F (37.2 C) 100.2 F (37.9 C) 98.4 F (36.9 C)   TempSrc: Oral Oral Oral Oral  SpO2: 99% 97% 96% 97%  Weight:      Height:        Intake/Output Summary (Last 24 hours) at 08/09/2019 1229 Last data filed at 08/09/2019 1128 Gross per 24 hour  Intake 2863.63 ml  Output 2475 ml  Net 388.63 ml   Filed Weights   08/06/19 1945  Weight: (!) 165.6 kg    Exam:  GEN: NAD SKIN: No rash EYES: EOMI ENT: MMM CV: RRR PULM: CTA B ABD: soft, obese, NT, +BS CNS: AAO x 3, non focal EXT: No edema or tenderness GU: Left inguinal hernia without tenderness   Data Reviewed:   I have personally reviewed following labs and imaging studies:  Labs: Labs show the following:   Basic Metabolic Panel: Recent Labs  Lab 08/06/19 2259 08/07/19 0512  NA 136 136  K 3.9 4.1  CL 104 103  CO2 22 23  GLUCOSE 109* 135*  BUN 17 15  CREATININE 0.83 0.89  CALCIUM 9.3 8.8*   GFR Estimated Creatinine Clearance: 140.8 mL/min (by C-G formula based on SCr of 0.89 mg/dL). Liver Function Tests: Recent Labs  Lab 08/06/19 2259  AST 31  ALT 32  ALKPHOS 63  BILITOT 0.6  PROT 7.4  ALBUMIN 4.0   No results for input(s): LIPASE, AMYLASE in the last 168 hours. No results for input(s): AMMONIA in the last 168 hours.  Coagulation profile No results for input(s): INR, PROTIME in the last 168 hours.  CBC: Recent Labs  Lab 08/06/19 2259 08/07/19 0512  WBC 14.1* 10.9*  NEUTROABS 12.4*  --   HGB 13.5 13.1  HCT 40.9 39.6  MCV 87.0 86.3  PLT 264 231   Cardiac Enzymes: No results for input(s): CKTOTAL, CKMB, CKMBINDEX, TROPONINI in the last 168 hours. BNP (last 3 results) No results for input(s): PROBNP in the last 8760 hours. CBG: Recent Labs  Lab 08/07/19 1123 08/07/19 1643 08/08/19 1630 08/09/19 0732 08/09/19 1127  GLUCAP 164* 110* 95 100* 140*   D-Dimer: No results for input(s): DDIMER in the last 72 hours. Hgb A1c: Recent Labs    08/07/19 0132  HGBA1C 7.0*   Lipid Profile: No results for input(s): CHOL, HDL, LDLCALC, TRIG,  CHOLHDL, LDLDIRECT in the last 72 hours. Thyroid function studies: No results for input(s): TSH, T4TOTAL, T3FREE, THYROIDAB in the last 72 hours.  Invalid input(s): FREET3 Anemia work up: Recent Labs    08/07/19 0132  FERRITIN 195   Sepsis Labs: Recent Labs  Lab 08/06/19 2259 08/07/19 0512  WBC 14.1* 10.9*  LATICACIDVEN 1.7  --     Microbiology Recent Results (from the past 240 hour(s))  Blood culture (routine x 2)     Status: Abnormal (Preliminary result)   Collection Time: 08/06/19 10:59 PM   Specimen: BLOOD  Result Value Ref Range Status   Specimen Description   Final    BLOOD BLOOD LEFT HAND Performed at [facility], [address], [place], [place] [address]    Special Requests   Final    BOTTLES DRAWN AEROBIC AND ANAEROBIC Blood Culture adequate volume Performed at [facility], [address], [place], [place] [address]    Culture  Setup Time   Final    GRAM NEGATIVE RODS AEROBIC BOTTLE ONLY CRITICAL RESULT CALLED TO, READ BACK BY AND VERIFIED WITH: [facility] NAZARI AT 6063 08/07/2019.PMF    Culture (A)  Final    ESCHERICHIA COLI SUSCEPTIBILITIES TO FOLLOW Performed at [facility], [place] [address] [place], [place] [address]    Report Status PENDING  Incomplete  Blood culture (routine x 2)     Status: None (Preliminary result)   Collection Time: 08/06/19 10:59 PM   Specimen: BLOOD  Result Value Ref Range Status   Specimen Description BLOOD BLOOD RIGHT HAND  Final   Special Requests   Final    BOTTLES DRAWN AEROBIC AND ANAEROBIC Blood Culture results may not be optimal due to an excessive volume of blood received in culture bottles   Culture   Final    NO GROWTH 2 DAYS Performed at [facility], [address] [place], [place] [address]    Report Status PENDING  Incomplete  Urine culture     Status: None   Collection Time: 08/06/19 10:59 PM   Specimen: Urine, Random  Result Value Ref Range Status   Specimen  Description   Final    URINE, RANDOM Performed at [facility], [address], [place], [place] [address]    Special Requests   Final    Normal Performed at [facility], [address], [place], [place] [address]    Culture   Final    NO GROWTH Performed at [facility], [place] [address], [place],  [address]    Report Status 08/08/2019 FINAL  Final  Blood Culture ID Panel (Reflexed)     Status:  Abnormal   Collection Time: 08/06/19 10:59 PM  Result Value Ref Range Status   Enterococcus species NOT DETECTED NOT DETECTED Final   Listeria monocytogenes NOT DETECTED NOT DETECTED Final   Staphylococcus species NOT DETECTED NOT DETECTED Final   Staphylococcus aureus (BCID) NOT DETECTED NOT DETECTED Final   Streptococcus species NOT DETECTED NOT DETECTED Final   Streptococcus agalactiae NOT DETECTED NOT DETECTED Final   Streptococcus pneumoniae NOT DETECTED NOT DETECTED Final   Streptococcus pyogenes NOT DETECTED NOT DETECTED Final   Acinetobacter baumannii NOT DETECTED NOT DETECTED Final   Enterobacteriaceae species DETECTED (A) NOT DETECTED Final    Comment: Enterobacteriaceae represent a large family of gram-negative bacteria, not a single organism. CRITICAL RESULT CALLED TO, READ BACK BY AND VERIFIED WITH: Mila Merry AT 1524 08/07/2019.PMF    Enterobacter cloacae complex NOT DETECTED NOT DETECTED Final   Escherichia coli DETECTED (A) NOT DETECTED Final    Comment: CRITICAL RESULT CALLED TO, READ BACK BY AND VERIFIED WITH: WALID NAZARI AT 1524 08/07/2019.PMF    Klebsiella oxytoca NOT DETECTED NOT DETECTED Final   Klebsiella pneumoniae NOT DETECTED NOT DETECTED Final   Proteus species NOT DETECTED NOT DETECTED Final   Serratia marcescens NOT DETECTED NOT DETECTED Final   Carbapenem resistance NOT DETECTED NOT DETECTED Final   Haemophilus influenzae NOT DETECTED NOT DETECTED Final   Neisseria meningitidis NOT DETECTED NOT DETECTED Final    Pseudomonas aeruginosa NOT DETECTED NOT DETECTED Final   Candida albicans NOT DETECTED NOT DETECTED Final   Candida glabrata NOT DETECTED NOT DETECTED Final   Candida krusei NOT DETECTED NOT DETECTED Final   Candida parapsilosis NOT DETECTED NOT DETECTED Final   Candida tropicalis NOT DETECTED NOT DETECTED Final    Comment: Performed at [facility], [address] [place], [place] [address]  SARS CORONAVIRUS 2 (TAT 6-24 HRS) Nasopharyngeal Nasopharyngeal Swab     Status: None   Collection Time: 08/06/19 11:13 PM   Specimen: Nasopharyngeal Swab  Result Value Ref Range Status   SARS Coronavirus 2 NEGATIVE NEGATIVE Final    Comment: (NOTE) SARS-CoV-2 target nucleic acids are NOT DETECTED. The SARS-CoV-2 RNA is generally detectable in upper and lower respiratory specimens during the acute phase of infection. Negative results do not preclude SARS-CoV-2 infection, do not rule out co-infections with other pathogens, and should not be used as the sole basis for treatment or other patient management decisions. Negative results must be combined with clinical observations, patient history, and epidemiological information. The expected result is Negative. Fact Sheet for Patients: HairSlick.no Fact Sheet for Healthcare Providers: quierodirigir.com This test is not yet approved or cleared by the [place] FDA and  has been authorized for detection and/or diagnosis of SARS-CoV-2 by FDA under an Emergency Use Authorization (EUA). This EUA will remain  in effect (meaning this test can be used) for the duration of the COVID-19 declaration under Section 56 4(b)(1) of the Act, 21 U.S.C. section 360bbb-3(b)(1), unless the authorization is terminated or revoked sooner. Performed at [facility], [address] [place], [place] [address]   Group A Strep by PCR [facility] Only)     Status: None   Collection Time: 08/07/19  3:55 PM    Specimen: Throat; Sterile Swab  Result Value Ref Range Status   Group A Strep by PCR NOT DETECTED NOT DETECTED Final    Comment: Performed at [facility], [address] [place], [place] [address]    Procedures and diagnostic studies:  No results  found.  Medications:   . aspirin EC  81 mg Oral Daily  . enoxaparin (LOVENOX) injection  40 mg Subcutaneous Q12H  . fluticasone  2 spray Each Nare Daily  . guaiFENesin  600 mg Oral BID  . insulin aspart  0-9 Units Subcutaneous TID WC  . irbesartan  75 mg Oral Daily  . nicotine  21 mg Transdermal Daily   Continuous Infusions: . sodium chloride 100 mL/hr at 08/09/19 0555  . azithromycin Stopped (08/09/19 0255)  . meropenem (MERREM) IV Stopped (08/09/19 [id number])     LOS: 2 days   Kavian Peters  [facility] Pager [phone].   *Please refer to [email or web address], password [email or web address] to get updated schedule on who will round on this patient, as hospitalists switch teams weekly. If 7PM-7AM, please contact night-coverage at [email or web address], password [email or web address] for any overnight needs.  08/09/2019, 12:29 PM

## 2019-08-10 DIAGNOSIS — A4151 Sepsis due to Escherichia coli [E. coli]: Principal | ICD-10-CM

## 2019-08-10 DIAGNOSIS — B9620 Unspecified Escherichia coli [E. coli] as the cause of diseases classified elsewhere: Secondary | ICD-10-CM

## 2019-08-10 DIAGNOSIS — R7881 Bacteremia: Secondary | ICD-10-CM

## 2019-08-10 LAB — GLUCOSE, CAPILLARY
Glucose-Capillary: 143 mg/dL — ABNORMAL HIGH (ref 70–99)
Glucose-Capillary: 138 mg/dL — ABNORMAL HIGH (ref 70–99)
Glucose-Capillary: 139 mg/dL — ABNORMAL HIGH (ref 70–99)
Glucose-Capillary: 107 mg/dL — ABNORMAL HIGH (ref 70–99)
Glucose-Capillary: 136 mg/dL — ABNORMAL HIGH (ref 70–99)
Glucose-Capillary: 93 mg/dL (ref 70–99)
Glucose-Capillary: 110 mg/dL — ABNORMAL HIGH (ref 70–99)

## 2019-08-10 LAB — CULTURE, BLOOD (ROUTINE X 2): Special Requests: ADEQUATE

## 2019-08-10 MED ORDER — LEVOFLOXACIN 750 MG PO TABS
750.0000 mg | ORAL_TABLET | Freq: Every day | ORAL | Status: DC
  Administered 2019-08-10: 750 mg via ORAL
  Filled 2019-08-10: qty 1

## 2019-08-10 MED ORDER — LEVOFLOXACIN 750 MG PO TABS: 750.0000 mg | ORAL_TABLET | Freq: Every day | ORAL | 0 refills | Status: DC

## 2019-08-10 NOTE — Discharge Summary (Signed)
Physician Discharge Summary  Jerome Cunningham YYT:[id number] DOB: 1956-07-08 DOA: 08/06/2019  PCP: Dion Body, MD  Admit date: 08/06/2019 Discharge date: 08/10/2019  Discharge disposition: Home   Recommendations for Outpatient Follow-Up:   Outpatient follow-up with PCP in 1 week   Discharge Diagnosis:   Active Problems:   Diabetes mellitus without complication ([place])   Hypertension, essential   OSA on CPAP   Fever   Severe sepsis (HCC)   ESBL (extended spectrum beta-lactamase) producing bacteria infection   Uncontrolled hypertension   Tobacco abuse    Discharge Condition: Stable.  Diet recommendation: Low-salt and low-fat diet  Code status: Full code    Hospital Course:    Jerome Cunningham is an 63 y.o. male with medical history significant for morbid obesity, type II DM, hypertension, tobacco abuse, COPD, OSA, presented to the hospital because of fever, chills and mild sore throat.  He was admitted to the hospital for sepsis and blood culture grew E. Coli.  The source of E. coli bacteremia was not discovered.  He has been afebrile for more than 24 hours and repeat blood culture has not shown any bacteremia thus far.  Urinalysis and chest x-ray were unremarkable.  Group A strep was not detected.  Coronavirus test was negative.  CTA chest did not show any acute abnormality and 2D echo did not show any vegetations.  His condition has improved and she is deemed stable for discharge to home.    Medical Consultants:    None.   Discharge Exam:   Vitals:   08/10/19 0533 08/10/19 0617  BP: 119/77   Pulse: 70   Resp:  18  Temp: 98.8 F (37.1 C)   SpO2: 95%    Vitals:   08/09/19 2024 08/09/19 2028 08/10/19 0533 08/10/19 0617  BP: (!) 141/81  119/77   Pulse: 72  70   Resp:  18  18  Temp: 98.4 F (36.9 C)  98.8 F (37.1 C)   TempSrc: Oral  Axillary   SpO2: 98%  95%   Weight:      Height:         GEN: NAD SKIN: No rash EYES: EOMI  ENT: MMM CV: RRR PULM: CTA B ABD: soft, obese, NT, +BS CNS: AAO x 3, non focal EXT: No edema or tenderness GU: Left inguinal hernia without any complication   The results of significant diagnostics from this hospitalization (including imaging, microbiology, ancillary and laboratory) are listed below for reference.     Procedures and Diagnostic Studies:   No results found.   Labs:   Basic Metabolic Panel: Recent Labs  Lab 08/06/19 2259 08/07/19 0512  NA 136 136  K 3.9 4.1  CL 104 103  CO2 22 23  GLUCOSE 109* 135*  BUN 17 15  CREATININE 0.83 0.89  CALCIUM 9.3 8.8*   GFR Estimated Creatinine Clearance: 140.8 mL/min (by C-G formula based on SCr of 0.89 mg/dL). Liver Function Tests: Recent Labs  Lab 08/06/19 2259  AST 31  ALT 32  ALKPHOS 63  BILITOT 0.6  PROT 7.4  ALBUMIN 4.0   No results for input(s): LIPASE, AMYLASE in the last 168 hours. No results for input(s): AMMONIA in the last 168 hours. Coagulation profile No results for input(s): INR, PROTIME in the last 168 hours.  CBC: Recent Labs  Lab 08/06/19 2259 08/07/19 0512  WBC 14.1* 10.9*  NEUTROABS 12.4*  --   HGB 13.5 13.1  HCT 40.9 39.6  MCV 87.0 86.3  PLT 264 231   Cardiac Enzymes: No results for input(s): CKTOTAL, CKMB, CKMBINDEX, TROPONINI in the last 168 hours. BNP: Invalid input(s): POCBNP CBG: Recent Labs  Lab 08/08/19 1630 08/09/19 0732 08/09/19 1127 08/09/19 1651 08/10/19 0740  GLUCAP 95 100* 140* 127* 93   D-Dimer No results for input(s): DDIMER in the last 72 hours. Hgb A1c No results for input(s): HGBA1C in the last 72 hours. Lipid Profile No results for input(s): CHOL, HDL, LDLCALC, TRIG, CHOLHDL, LDLDIRECT in the last 72 hours. Thyroid function studies No results for input(s): TSH, T4TOTAL, T3FREE, THYROIDAB in the last 72 hours.  Invalid input(s): FREET3 Anemia work up No results for input(s): VITAMINB12, FOLATE, FERRITIN, TIBC, IRON, RETICCTPCT in the last 72  hours. Microbiology Recent Results (from the past 240 hour(s))  Blood culture (routine x 2)     Status: Abnormal   Collection Time: 08/06/19 10:59 PM   Specimen: BLOOD  Result Value Ref Range Status   Specimen Description   Final    BLOOD BLOOD LEFT HAND Performed at [facility], [address] [place], [place] [address]    Special Requests   Final    BOTTLES DRAWN AEROBIC AND ANAEROBIC Blood Culture adequate volume Performed at [facility], [address] [place], [place] [address]    Culture  Setup Time   Final    GRAM NEGATIVE RODS AEROBIC BOTTLE ONLY CRITICAL RESULT CALLED TO, READ BACK BY AND VERIFIED WITH: [facility] LLC[facility]ALID NAZARI AT 1524 08/07/2019.PMF Performed at [facility], [address] [place], [place] [address]    Culture ESCHERICHIA COLI (A)  Final   Report Status 08/10/2019 FINAL  Final   Organism ID, Bacteria ESCHERICHIA COLI  Final      Susceptibility   Escherichia coli - MIC*    AMPICILLIN <=2 SENSITIVE Sensitive     CEFAZOLIN <=4 SENSITIVE Sensitive     CEFEPIME <=1 SENSITIVE Sensitive     CEFTAZIDIME <=1 SENSITIVE Sensitive     CEFTRIAXONE <=1 SENSITIVE Sensitive     CIPROFLOXACIN <=0.25 SENSITIVE Sensitive     GENTAMICIN <=1 SENSITIVE Sensitive     IMIPENEM <=0.25 SENSITIVE Sensitive     TRIMETH/SULFA <=20 SENSITIVE Sensitive     AMPICILLIN/SULBACTAM <=2 SENSITIVE Sensitive     PIP/TAZO <=4 SENSITIVE Sensitive     Extended ESBL NEGATIVE Sensitive     * ESCHERICHIA COLI  Blood culture (routine x 2)     Status: None (Preliminary result)   Collection Time: 08/06/19 10:59 PM   Specimen: BLOOD  Result Value Ref Range Status   Specimen Description BLOOD BLOOD RIGHT HAND  Final   Special Requests   Final    BOTTLES DRAWN AEROBIC AND ANAEROBIC Blood Culture results may not be optimal due to an excessive volume of blood received in culture bottles   Culture   Final    NO GROWTH 4 DAYS Performed at [facility], [address] [place], [place] [address]    Report Status PENDING  Incomplete  Urine culture     Status: None   Collection Time: 08/06/19 10:59 PM   Specimen: Urine, Random  Result Value Ref Range Status   Specimen Description   Final    URINE, RANDOM Performed at [facility], [address] [place], [place] [address]    Special Requests   Final    Normal Performed at [facility], [address] [place], [place] [address]    Culture   Final  NO GROWTH Performed at [facility], [address] [place], [place] [address]    Report Status 08/08/2019 FINAL  Final  Blood Culture ID Panel (Reflexed)     Status: Abnormal   Collection Time: 08/06/19 10:59 PM  Result Value Ref Range Status   Enterococcus species NOT DETECTED NOT DETECTED Final   Listeria monocytogenes NOT DETECTED NOT DETECTED Final   Staphylococcus species NOT DETECTED NOT DETECTED Final   Staphylococcus aureus (BCID) NOT DETECTED NOT DETECTED Final   Streptococcus species NOT DETECTED NOT DETECTED Final   Streptococcus agalactiae NOT DETECTED NOT DETECTED Final   Streptococcus pneumoniae NOT DETECTED NOT DETECTED Final   Streptococcus pyogenes NOT DETECTED NOT DETECTED Final   Acinetobacter baumannii NOT DETECTED NOT DETECTED Final   Enterobacteriaceae species DETECTED (A) NOT DETECTED Final    Comment: Enterobacteriaceae represent a large family of gram-negative bacteria, not a single organism. CRITICAL RESULT CALLED TO, READ BACK BY AND VERIFIED WITH: Mila Merry AT 1524 08/07/2019.PMF    Enterobacter cloacae complex NOT DETECTED NOT DETECTED Final   Escherichia coli DETECTED (A) NOT DETECTED Final    Comment: CRITICAL RESULT CALLED TO, READ BACK BY AND VERIFIED WITH: WALID NAZARI AT 1524 08/07/2019.PMF    Klebsiella oxytoca NOT DETECTED NOT DETECTED Final   Klebsiella pneumoniae NOT DETECTED NOT DETECTED Final   Proteus species NOT DETECTED NOT DETECTED Final   Serratia marcescens NOT  DETECTED NOT DETECTED Final   Carbapenem resistance NOT DETECTED NOT DETECTED Final   Haemophilus influenzae NOT DETECTED NOT DETECTED Final   Neisseria meningitidis NOT DETECTED NOT DETECTED Final   Pseudomonas aeruginosa NOT DETECTED NOT DETECTED Final   Candida albicans NOT DETECTED NOT DETECTED Final   Candida glabrata NOT DETECTED NOT DETECTED Final   Candida krusei NOT DETECTED NOT DETECTED Final   Candida parapsilosis NOT DETECTED NOT DETECTED Final   Candida tropicalis NOT DETECTED NOT DETECTED Final    Comment: Performed at [facility], [address] [place], [place] [address]  SARS CORONAVIRUS 2 (TAT 6-24 HRS) Nasopharyngeal Nasopharyngeal Swab     Status: None   Collection Time: 08/06/19 11:13 PM   Specimen: Nasopharyngeal Swab  Result Value Ref Range Status   SARS Coronavirus 2 NEGATIVE NEGATIVE Final    Comment: (NOTE) SARS-CoV-2 target nucleic acids are NOT DETECTED. The SARS-CoV-2 RNA is generally detectable in upper and lower respiratory specimens during the acute phase of infection. Negative results do not preclude SARS-CoV-2 infection, do not rule out co-infections with other pathogens, and should not be used as the sole basis for treatment or other patient management decisions. Negative results must be combined with clinical observations, patient history, and epidemiological information. The expected result is Negative. Fact Sheet for Patients: HairSlick.no Fact Sheet for Healthcare Providers: quierodirigir.com This test is not yet approved or cleared by the [place] FDA and  has been authorized for detection and/or diagnosis of SARS-CoV-2 by FDA under an Emergency Use Authorization (EUA). This EUA will remain  in effect (meaning this test can be used) for the duration of the COVID-19 declaration under Section 56 4(b)(1) of the Act, 21 U.S.C. section 360bbb-3(b)(1), unless the authorization  is terminated or revoked sooner. Performed at [facility], [address] [place], [place] [address]   Group A Strep by PCR [facility] Only)     Status: None   Collection Time: 08/07/19  3:55 PM   Specimen: Throat; Sterile Swab  Result Value Ref Range Status   Group  A Strep by PCR NOT DETECTED NOT DETECTED Final    Comment: Performed at [facility], [address] [place], [place] [address]  CULTURE, BLOOD (ROUTINE X 2) w Reflex to ID Panel     Status: None (Preliminary result)   Collection Time: 08/09/19  1:26 PM   Specimen: BLOOD  Result Value Ref Range Status   Specimen Description BLOOD BLOOD LEFT FOREARM  Final   Special Requests   Final    BOTTLES DRAWN AEROBIC AND ANAEROBIC Blood Culture adequate volume   Culture   Final    NO GROWTH < 24 HOURS Performed at [facility], [address], [place], [place] [address]    Report Status PENDING  Incomplete  CULTURE, BLOOD (ROUTINE X 2) w Reflex to ID Panel     Status: None (Preliminary result)   Collection Time: 08/09/19  1:36 PM   Specimen: BLOOD  Result Value Ref Range Status   Specimen Description BLOOD BLOOD LEFT HAND  Final   Special Requests   Final    BOTTLES DRAWN AEROBIC AND ANAEROBIC Blood Culture adequate volume   Culture   Final    NO GROWTH < 24 HOURS Performed at [facility], [address], [place], [place] [address]    Report Status PENDING  Incomplete     Discharge Instructions:   Discharge Instructions    Diet - low sodium heart healthy   Complete by: As directed    Increase activity slowly   Complete by: As directed      Allergies as of 08/10/2019      Reactions   Tramadol Other (See Comments)   "increases pain"      Medication List    TAKE these medications   acetaminophen 500 MG tablet Commonly known as: TYLENOL Take 500 mg by mouth every 6 (six) hours as needed for moderate pain.   aspirin EC 81 MG tablet Take 81 mg by mouth every 6 (six) hours as  needed for moderate pain.   aspirin-acetaminophen-caffeine 250-250-65 MG tablet Commonly known as: EXCEDRIN MIGRAINE Take 2 tablets by mouth every 6 (six) hours as needed for headache.   levofloxacin 750 MG tablet Commonly known as: LEVAQUIN Take 1 tablet (750 mg total) by mouth daily. Start taking on: August 11, 2019   losartan 100 MG tablet Commonly known as: COZAAR Take 100 mg by mouth daily.   metFORMIN 500 MG tablet Commonly known as: GLUCOPHAGE Take 500 mg by mouth 2 (two) times daily with a meal.      Follow-up Information    Schedule an appointment as soon as possible for a visit  with Marisue Ivan, MD.   Specialty: Family Medicine Why: For follow up Contact information: [address] [facility] [place] [address] [phone]            Time coordinating discharge: 25 MINUTES  Signed:  Lurene Shadow  Pager [phone] [facility] 08/10/2019, 1:23 PM

## 2019-08-11 LAB — CULTURE, BLOOD (ROUTINE X 2): Culture: NO GROWTH

## 2019-08-14 LAB — CULTURE, BLOOD (ROUTINE X 2)
Culture: NO GROWTH
Culture: NO GROWTH
Special Requests: ADEQUATE
Special Requests: ADEQUATE

## 2019-09-13 ENCOUNTER — Telehealth: Payer: Self-pay | Admitting: Urology

## 2019-09-13 DIAGNOSIS — N200 Calculus of kidney: Secondary | ICD-10-CM

## 2019-09-13 DIAGNOSIS — R109 Unspecified abdominal pain: Secondary | ICD-10-CM

## 2019-09-13 NOTE — Telephone Encounter (Signed)
All he had was a KUB which showed a 7 mm calcification overlying the kidney.  This may be a nonobstructing stone and not the cause of his pain.  I put in an order for a stone protocol CT.  We can go ahead and get that scheduled and decide on urgency based on CT results.

## 2019-09-29 ENCOUNTER — Other Ambulatory Visit: Payer: Self-pay

## 2019-09-29 ENCOUNTER — Ambulatory Visit
Admission: RE | Admit: 2019-09-29 | Discharge: 2019-09-29 | Disposition: A | Discharge status: Home / Self Care | Source: Ambulatory Visit | Attending: Urology | Admitting: Urology

## 2019-09-29 DIAGNOSIS — N200 Calculus of kidney: Secondary | ICD-10-CM

## 2019-09-29 DIAGNOSIS — R109 Unspecified abdominal pain: Secondary | ICD-10-CM | POA: Diagnosis present

## 2019-10-03 ENCOUNTER — Telehealth: Payer: Self-pay | Admitting: Urology

## 2019-10-03 DIAGNOSIS — N201 Calculus of ureter: Secondary | ICD-10-CM

## 2019-10-03 MED ORDER — TAMSULOSIN HCL 0.4 MG PO CAPS: 0.4000 mg | ORAL_CAPSULE | Freq: Every day | ORAL | 0 refills | Status: DC

## 2019-10-03 NOTE — Telephone Encounter (Signed)
I contacted Mr. Jerome Cunningham regarding recent CT.  He had seen Dr. Burnadette Pop complaining of back pain and KUB was performed which showed a 7 mm calcification overlying the renal outline.  A CT was recommended and does show a 4 x 5 x 6 mm left proximal ureteral calculus without hydronephrosis.  He states he was placed on tamsulosin by Dr. Burnadette Pop and has 4-5 days left.  He is minimally symptomatic.  We discussed options of continued trial of passage, shockwave lithotripsy and ureteroscopic removal.  Based on his minimal symptoms he has elected a continued trial of passage.  An additional Rx tamsulosin was sent to pharmacy.  He declined pain medication Rx.  An order was placed for a repeat KUB in approximately 2 weeks.  He was instructed to call earlier for increasing pain or desire to schedule definitive treatment.

## 2020-09-08 ENCOUNTER — Telehealth: Payer: Self-pay | Admitting: *Deleted

## 2020-09-08 NOTE — Telephone Encounter (Signed)
Contacted in attempt to schedule lung screening scan. Patient refuses currently, says he will discuss further with his PCP and contact me if he desires to have screening in the future.

## 2020-10-17 ENCOUNTER — Telehealth: Payer: Self-pay | Admitting: *Deleted

## 2020-10-17 NOTE — Telephone Encounter (Signed)
Called patient to follow up, schedule CT Lung screening follow up exam. Patient states he does not wish to schedule at this time, has recently moved to [place] and is unsure if he will remain in this area for further care. Patient has contact information for Shawn, will call back if he would like to schedule.

## 2021-01-22 DIAGNOSIS — K4090 Unilateral inguinal hernia, without obstruction or gangrene, not specified as recurrent: Secondary | ICD-10-CM

## 2021-01-22 HISTORY — DX: Unilateral inguinal hernia, without obstruction or gangrene, not specified as recurrent: K40.90

## 2021-01-27 ENCOUNTER — Other Ambulatory Visit: Payer: Self-pay | Admitting: Orthopedic Surgery

## 2021-02-13 ENCOUNTER — Other Ambulatory Visit: Payer: Self-pay

## 2021-02-13 ENCOUNTER — Other Ambulatory Visit
Admission: RE | Admit: 2021-02-13 | Discharge: 2021-02-13 | Disposition: A | Discharge status: Home / Self Care | Source: Ambulatory Visit | Attending: Orthopedic Surgery | Admitting: Orthopedic Surgery

## 2021-02-13 DIAGNOSIS — Z01818 Encounter for other preprocedural examination: Secondary | ICD-10-CM | POA: Diagnosis present

## 2021-02-13 HISTORY — DX: Pure hypercholesterolemia, unspecified: E78.00

## 2021-02-13 HISTORY — DX: Nicotine dependence, unspecified, uncomplicated: F17.200

## 2021-02-13 HISTORY — DX: Personal history of urinary calculi: Z87.442

## 2021-02-13 HISTORY — DX: Chronic obstructive pulmonary disease, unspecified: J44.9

## 2021-02-13 LAB — COMPREHENSIVE METABOLIC PANEL
ALT: 31 U/L (ref 0–44)
AST: 27 U/L (ref 15–41)
Albumin: 4.4 g/dL (ref 3.5–5.0)
Alkaline Phosphatase: 58 U/L (ref 38–126)
Anion gap: 9 (ref 5–15)
BUN: 19 mg/dL (ref 8–23)
CO2: 25 mmol/L (ref 22–32)
Calcium: 9.6 mg/dL (ref 8.9–10.3)
Chloride: 100 mmol/L (ref 98–111)
Creatinine, Ser: 0.84 mg/dL (ref 0.61–1.24)
GFR, Estimated: >60 mL/min (ref >60)
Glucose, Bld: 98 mg/dL (ref 70–99)
Potassium: 4.1 mmol/L (ref 3.5–5.1)
Sodium: 134 mmol/L — ABNORMAL LOW (ref 135–145)
Total Bilirubin: 0.7 mg/dL (ref 0.3–1.2)
Total Protein: 7.4 g/dL (ref 6.5–8.1)

## 2021-02-13 LAB — URINALYSIS, ROUTINE W REFLEX MICROSCOPIC
Bilirubin Urine: NEGATIVE
Glucose, UA: NEGATIVE mg/dL
Hgb urine dipstick: NEGATIVE
Ketones, ur: NEGATIVE mg/dL
Leukocytes,Ua: NEGATIVE
Nitrite: NEGATIVE
Protein, ur: NEGATIVE mg/dL
Specific Gravity, Urine: 1.013 (ref 1.005–1.030)
pH: 5 (ref 5.0–8.0)

## 2021-02-13 LAB — TYPE AND SCREEN
ABO/RH(D): O POS
Antibody Screen: NEGATIVE

## 2021-02-13 LAB — CBC WITH DIFFERENTIAL/PLATELET
Abs Immature Granulocytes: 0.04 10*3/uL (ref 0.00–0.07)
Basophils Absolute: 0.1 10*3/uL (ref 0.0–0.1)
Basophils Relative: 1 %
Eosinophils Absolute: 0.3 10*3/uL (ref 0.0–0.5)
Eosinophils Relative: 3 %
HCT: 41.7 % (ref 39.0–52.0)
Hemoglobin: 14.1 g/dL (ref 13.0–17.0)
Immature Granulocytes: 0 %
Lymphocytes Relative: 22 %
Lymphs Abs: 2.1 10*3/uL (ref 0.7–4.0)
MCH: 29.2 pg (ref 26.0–34.0)
MCHC: 33.8 g/dL (ref 30.0–36.0)
MCV: 86.3 fL (ref 80.0–100.0)
Monocytes Absolute: 0.6 10*3/uL (ref 0.1–1.0)
Monocytes Relative: 7 %
Neutro Abs: 6.1 10*3/uL (ref 1.7–7.7)
Neutrophils Relative %: 67 %
Platelets: 286 10*3/uL (ref 150–400)
RBC: 4.83 MIL/uL (ref 4.22–5.81)
RDW: 13.5 % (ref 11.5–15.5)
WBC: 9.2 10*3/uL (ref 4.0–10.5)
nRBC: 0 % (ref 0.0–0.2)

## 2021-02-13 LAB — SURGICAL PCR SCREEN
MRSA, PCR: NEGATIVE
Staphylococcus aureus: NEGATIVE

## 2021-02-13 NOTE — Patient Instructions (Signed)
INSTRUCTIONS FOR SURGERY     Your surgery is scheduled for:   Thursday, June 2ND     To find out your arrival time for the day of surgery,          please call [phone] between 1 pm and 3 pm on : Wednesday, June 1ST     When you arrive for surgery, report to the REGISTRATION DESK ON THE FIRST FLOOR OF THE MEDICAL MALL. ONCE THEY HAVE COMPLETED THEIR PROCESS, PROCEED TO THE SECOND FLOOR AND SIGN IN AT THE SURGERY DESK.    REMEMBER: Instructions that are not followed completely may result in serious medical risk,  up to and including death, or upon the discretion of your surgeon and anesthesiologist,            your surgery may need to be rescheduled.  __X__ 1. Do not eat food after midnight the night before your procedure.                    No gum, candy, lozenger, tic tacs, tums or hard candies.                  ABSOLUTELY NOTHING SOLID IN YOUR MOUTH AFTER MIDNIGHT                    You may drink unlimited clear liquids up to 2 hours before you are scheduled to arrive for surgery.                   Do not drink anything within those 2 hours unless you need to take medicine, then take the                   smallest amount you need.  Clear liquids include:  Water, Black coffee, black tea.                    Sugar may be added but no dairy/ honey /lemon.                                       Broth and jello is not considered a clear liquid.  __x__  2. On the morning of surgery, please brush your teeth with toothpaste and water. You may rinse with                  mouthwash if you wish but DO NOT SWALLOW TOOTHPASTE OR MOUTHWASH  __X___3. NO alcohol for 24 hours before or after surgery.  __x___ 4.  Do NOT smoke or use e-cigarettes for 24 HOURS PRIOR TO SURGERY.                      DO NOT Use any chewable tobacco products for at least 6 hours prior to surgery.  __x___ 5. If you start any new medication after this appointment  and prior to surgery, please                   Bring it with you on the day  of surgery.  ___x__ 6. Notify your doctor if there is any change in your medical condition, such as fever,                  infection, vomitting, diarrhea or any open sores.  __x___ 7.  USE the CHG SOAP as instructed, the night before surgery and the day of surgery.                   Once you have washed with this soap, do NOT use any of the following: Powders, perfumes                    or lotions. Please do not wear make up, hairpins, clips or nail polish. You MAY wear deodorant.                   Men may shave their face and neck.  Women need to shave 48 hours prior to surgery.                   DO NOT wear ANY jewelry on the day of surgery. If there are rings that are too tight to                    remove easily, please address this prior to the surgery day. Piercings need to be removed.                                                                     NO METAL ON YOUR BODY.                    Do NOT bring any valuables.  If you came to Pre-Admit testing then you will not need license,                     insurance card or credit card.  If you will be staying overnight, please either leave your things in                     the car or have your family be responsible for these items.                     [facility] IS NOT RESPONSIBLE FOR BELONGINGS OR VALUABLES.  ___X__ 8. DO NOT wear contact lenses on surgery day.  You may not have dentures,                     Hearing aides, contacts or glasses in the operating room. These items can be                    Placed in the Recovery Room to receive immediately after surgery.  __x___ 9. IF YOU ARE SCHEDULED TO GO HOME ON THE SAME DAY, YOU MUST                   Have someone to drive you home and to stay with you  for the first 24 hours.                    Have an arrangement prior  to arriving on surgery day.  ___x__ 10. Take the following medications on the  morning of surgery with a sip of water:                              1. NOTHING                     2.                     3.  _____ 11.  Follow any instructions provided to you by your surgeon.                        Such as enema, clear liquid bowel prep  __X__  12. STOP ALL ASPIRIN PRODUCTS AS OF  MAY 25TH                  THIS INCLUDES BC POWDERS / GOODIES POWDER / EXCEDRIN  __x___ 13. STOP Anti-inflammatories as of ONE WEEK PRIOR TO SURGERY.                      This includes IBUPROFEN / MOTRIN / ADVIL / ALEVE/ NAPROXYN                    YOU MAY TAKE TYLENOL ANY TIME PRIOR TO SURGERY.  __X___ 15. Bring your CPAP machine into preop with you on the morning of surgery.  _X_____16.  Stop Metformin 2 full days prior to surgery.  LAST DOSE ON MAY 30TH                       TAKE TRULICITY ON Wednesday.                                        Do NOT take any diabetes medications on surgery day.  __X____17.  Continue to take the following medications but do not take on the morning of surgery:                        HYDROCHLOROTHIAZIDE // MICARDIS  __X____18. If staying overnight, please have appropriate shoes to wear to be able to walk around the unit.                   Wear clean and comfortable clothing to the hospital.  REMEMBER TO BRING PHONE NUMBERS FOR YOUR CONTACT PEOPLE. BRING CELL PHONE AND CHARGER.   HAVE A STOOL SOFTENER AT HOME TO USE AFTER SURGERY. BRING A COPY OF YOUR POWER OF ATTORNEY AND LIVING WILL IF COMPLETED.

## 2021-02-21 ENCOUNTER — Other Ambulatory Visit
Admission: RE | Admit: 2021-02-21 | Discharge: 2021-02-21 | Disposition: A | Discharge status: Home / Self Care | Source: Ambulatory Visit | Attending: Orthopedic Surgery | Admitting: Orthopedic Surgery

## 2021-02-21 ENCOUNTER — Other Ambulatory Visit: Payer: Self-pay

## 2021-02-21 DIAGNOSIS — Z20822 Contact with and (suspected) exposure to covid-19: Secondary | ICD-10-CM | POA: Insufficient documentation

## 2021-02-21 DIAGNOSIS — Z01812 Encounter for preprocedural laboratory examination: Secondary | ICD-10-CM | POA: Insufficient documentation

## 2021-02-21 LAB — SARS CORONAVIRUS 2 (TAT 6-24 HRS): SARS Coronavirus 2: NEGATIVE

## 2021-02-23 ENCOUNTER — Inpatient Hospital Stay

## 2021-02-23 ENCOUNTER — Inpatient Hospital Stay
Admission: RE | Admit: 2021-02-23 | Discharge: 2021-02-25 | DRG: 470 | Disposition: A | Discharge status: Home Health | Attending: Orthopedic Surgery | Admitting: Orthopedic Surgery

## 2021-02-23 ENCOUNTER — Encounter
Admission: RE | Disposition: A | Discharge status: Home / Self Care | Payer: Self-pay | Source: Home / Self Care | Attending: Orthopedic Surgery

## 2021-02-23 ENCOUNTER — Encounter: Payer: Self-pay | Admitting: Orthopedic Surgery

## 2021-02-23 ENCOUNTER — Other Ambulatory Visit: Payer: Self-pay

## 2021-02-23 DIAGNOSIS — Z87442 Personal history of urinary calculi: Secondary | ICD-10-CM

## 2021-02-23 DIAGNOSIS — Z823 Family history of stroke: Secondary | ICD-10-CM

## 2021-02-23 DIAGNOSIS — Z7982 Long term (current) use of aspirin: Secondary | ICD-10-CM | POA: Diagnosis not present

## 2021-02-23 DIAGNOSIS — G8918 Other acute postprocedural pain: Secondary | ICD-10-CM

## 2021-02-23 DIAGNOSIS — F17210 Nicotine dependence, cigarettes, uncomplicated: Secondary | ICD-10-CM | POA: Diagnosis present

## 2021-02-23 DIAGNOSIS — I10 Essential (primary) hypertension: Secondary | ICD-10-CM | POA: Diagnosis present

## 2021-02-23 DIAGNOSIS — G4733 Obstructive sleep apnea (adult) (pediatric): Secondary | ICD-10-CM | POA: Diagnosis present

## 2021-02-23 DIAGNOSIS — Z7984 Long term (current) use of oral hypoglycemic drugs: Secondary | ICD-10-CM

## 2021-02-23 DIAGNOSIS — E119 Type 2 diabetes mellitus without complications: Secondary | ICD-10-CM | POA: Diagnosis present

## 2021-02-23 DIAGNOSIS — Z96649 Presence of unspecified artificial hip joint: Secondary | ICD-10-CM

## 2021-02-23 DIAGNOSIS — Z6841 Body Mass Index (BMI) 40.0 and over, adult: Secondary | ICD-10-CM | POA: Diagnosis not present

## 2021-02-23 DIAGNOSIS — M1612 Unilateral primary osteoarthritis, left hip: Secondary | ICD-10-CM | POA: Diagnosis present

## 2021-02-23 DIAGNOSIS — Z885 Allergy status to narcotic agent status: Secondary | ICD-10-CM | POA: Diagnosis not present

## 2021-02-23 DIAGNOSIS — Z20822 Contact with and (suspected) exposure to covid-19: Secondary | ICD-10-CM | POA: Diagnosis present

## 2021-02-23 DIAGNOSIS — E7800 Pure hypercholesterolemia, unspecified: Secondary | ICD-10-CM | POA: Diagnosis present

## 2021-02-23 DIAGNOSIS — Z79899 Other long term (current) drug therapy: Secondary | ICD-10-CM | POA: Diagnosis not present

## 2021-02-23 DIAGNOSIS — Z8249 Family history of ischemic heart disease and other diseases of the circulatory system: Secondary | ICD-10-CM

## 2021-02-23 DIAGNOSIS — J449 Chronic obstructive pulmonary disease, unspecified: Secondary | ICD-10-CM | POA: Diagnosis present

## 2021-02-23 DIAGNOSIS — Z419 Encounter for procedure for purposes other than remedying health state, unspecified: Secondary | ICD-10-CM

## 2021-02-23 HISTORY — PX: TOTAL HIP ARTHROPLASTY: SHX124

## 2021-02-23 LAB — GLUCOSE, CAPILLARY
Glucose-Capillary: 115 mg/dL — ABNORMAL HIGH (ref 70–99)
Glucose-Capillary: 101 mg/dL — ABNORMAL HIGH (ref 70–99)
Glucose-Capillary: 124 mg/dL — ABNORMAL HIGH (ref 70–99)
Glucose-Capillary: 172 mg/dL — ABNORMAL HIGH (ref 70–99)

## 2021-02-23 LAB — CBC
HCT: 38.8 % — ABNORMAL LOW (ref 39.0–52.0)
Hemoglobin: 12.9 g/dL — ABNORMAL LOW (ref 13.0–17.0)
MCH: 29.1 pg (ref 26.0–34.0)
MCHC: 33.2 g/dL (ref 30.0–36.0)
MCV: 87.6 fL (ref 80.0–100.0)
Platelets: 257 10*3/uL (ref 150–400)
RBC: 4.43 MIL/uL (ref 4.22–5.81)
RDW: 13.5 % (ref 11.5–15.5)
WBC: 17.1 10*3/uL — ABNORMAL HIGH (ref 4.0–10.5)
nRBC: 0 % (ref 0.0–0.2)

## 2021-02-23 LAB — CREATININE, SERUM
Creatinine, Ser: 0.83 mg/dL (ref 0.61–1.24)
GFR, Estimated: >60 mL/min (ref >60)

## 2021-02-23 SURGERY — ARTHROPLASTY, HIP, TOTAL, ANTERIOR APPROACH
Anesthesia: Spinal | Site: Hip | Laterality: Left

## 2021-02-23 MED ORDER — CEFAZOLIN IN SODIUM CHLORIDE 3-0.9 GM/100ML-% IV SOLN: 3.0000 g | INTRAVENOUS | Status: DC

## 2021-02-23 MED ORDER — METHOCARBAMOL 500 MG PO TABS: 500.0000 mg | ORAL_TABLET | Freq: Four times a day (QID) | ORAL | Status: DC | PRN

## 2021-02-23 MED ORDER — CEFAZOLIN IN SODIUM CHLORIDE 3-0.9 GM/100ML-% IV SOLN
3.0000 g | INTRAVENOUS | Status: DC
  Filled 2021-02-23: qty 100

## 2021-02-23 MED ORDER — SODIUM CHLORIDE 0.9 % IV SOLN
INTRAVENOUS | Status: DC | PRN
  Administered 2021-02-23: 60 mL

## 2021-02-23 MED ORDER — FAMOTIDINE 20 MG PO TABS
20.0000 mg | ORAL_TABLET | Freq: Once | ORAL | Status: AC
  Administered 2021-02-23: 20 mg via ORAL

## 2021-02-23 MED ORDER — FENTANYL CITRATE (PF) 100 MCG/2ML IJ SOLN
INTRAMUSCULAR | Status: DC | PRN
  Administered 2021-02-23 (×2): 50 ug via INTRAVENOUS

## 2021-02-23 MED ORDER — ONDANSETRON HCL 4 MG PO TABS
4.0000 mg | ORAL_TABLET | Freq: Four times a day (QID) | ORAL | Status: DC | PRN
Start: 2021-02-23 — End: 2021-02-25

## 2021-02-23 MED ORDER — ONDANSETRON HCL 4 MG/2ML IJ SOLN: 4.0000 mg | Freq: Once | INTRAMUSCULAR | Status: DC | PRN

## 2021-02-23 MED ORDER — MENTHOL 3 MG MT LOZG
1.0000 | LOZENGE | OROMUCOSAL | Status: DC | PRN
  Filled 2021-02-23: qty 9

## 2021-02-23 MED ORDER — PROPOFOL 10 MG/ML IV BOLUS
INTRAVENOUS | Status: DC | PRN
  Administered 2021-02-23: 20 mg via INTRAVENOUS
  Administered 2021-02-23: 10 mg via INTRAVENOUS

## 2021-02-23 MED ORDER — DEXTROSE 5 % IV SOLN
3.0000 g | INTRAVENOUS | Status: AC
  Administered 2021-02-23: 3 g via INTRAVENOUS
  Filled 2021-02-23: qty 3

## 2021-02-23 MED ORDER — POLYETHYLENE GLYCOL 3350 17 G PO PACK: 17.0000 g | PACK | Freq: Every day | ORAL | Status: DC | PRN

## 2021-02-23 MED ORDER — CHLORHEXIDINE GLUCONATE 0.12 % MT SOLN
15.0000 mL | Freq: Once | OROMUCOSAL | Status: AC
  Administered 2021-02-23: 15 mL via OROMUCOSAL

## 2021-02-23 MED ORDER — DEXAMETHASONE SODIUM PHOSPHATE 10 MG/ML IJ SOLN
INTRAMUSCULAR | Status: DC | PRN
  Administered 2021-02-23: 4 mg via INTRAVENOUS

## 2021-02-23 MED ORDER — METFORMIN HCL 500 MG PO TABS
1000.0000 mg | ORAL_TABLET | Freq: Two times a day (BID) | ORAL | Status: DC
  Administered 2021-02-24 – 2021-02-25 (×3): 1000 mg via ORAL
  Filled 2021-02-23 (×3): qty 2

## 2021-02-23 MED ORDER — PROPOFOL 1000 MG/100ML IV EMUL
INTRAVENOUS | Status: AC
  Filled 2021-02-23: qty 100

## 2021-02-23 MED ORDER — ACETAMINOPHEN 500 MG PO TABS
1000.0000 mg | ORAL_TABLET | Freq: Four times a day (QID) | ORAL | Status: AC
  Administered 2021-02-23 – 2021-02-24 (×4): 1000 mg via ORAL
  Filled 2021-02-23 (×4): qty 2

## 2021-02-23 MED ORDER — ENOXAPARIN SODIUM 40 MG/0.4ML IJ SOSY
40.0000 mg | PREFILLED_SYRINGE | INTRAMUSCULAR | Status: DC
  Administered 2021-02-24 – 2021-02-25 (×2): 40 mg via SUBCUTANEOUS
  Filled 2021-02-23 (×2): qty 0.4

## 2021-02-23 MED ORDER — CHLORHEXIDINE GLUCONATE 0.12 % MT SOLN
OROMUCOSAL | Status: AC
  Filled 2021-02-23: qty 15

## 2021-02-23 MED ORDER — ONDANSETRON HCL 4 MG/2ML IJ SOLN
INTRAMUSCULAR | Status: AC
  Filled 2021-02-23: qty 2

## 2021-02-23 MED ORDER — FAMOTIDINE 20 MG PO TABS
ORAL_TABLET | ORAL | Status: AC
  Filled 2021-02-23: qty 1

## 2021-02-23 MED ORDER — METHOCARBAMOL 1000 MG/10ML IJ SOLN
500.0000 mg | Freq: Four times a day (QID) | INTRAMUSCULAR | Status: DC | PRN
  Filled 2021-02-23: qty 5

## 2021-02-23 MED ORDER — DULAGLUTIDE 0.75 MG/0.5ML ~~LOC~~ SOAJ: 0.7500 mg | SUBCUTANEOUS | Status: DC

## 2021-02-23 MED ORDER — OXYCODONE HCL 5 MG PO TABS
5.0000 mg | ORAL_TABLET | ORAL | Status: DC | PRN
Start: 2021-02-23 — End: 2021-02-25
  Administered 2021-02-23: 10 mg via ORAL
  Filled 2021-02-23: qty 2

## 2021-02-23 MED ORDER — PROPOFOL 500 MG/50ML IV EMUL
INTRAVENOUS | Status: DC | PRN
  Administered 2021-02-23: 60 ug/kg/min via INTRAVENOUS

## 2021-02-23 MED ORDER — DEXAMETHASONE SODIUM PHOSPHATE 10 MG/ML IJ SOLN
INTRAMUSCULAR | Status: AC
  Filled 2021-02-23: qty 1

## 2021-02-23 MED ORDER — BUPIVACAINE HCL (PF) 0.5 % IJ SOLN
INTRAMUSCULAR | Status: DC | PRN
  Administered 2021-02-23: 3 mL

## 2021-02-23 MED ORDER — EPHEDRINE SULFATE 50 MG/ML IJ SOLN
INTRAMUSCULAR | Status: DC | PRN
  Administered 2021-02-23: 10 mg via INTRAVENOUS
  Administered 2021-02-23: 15 mg via INTRAVENOUS

## 2021-02-23 MED ORDER — PRAVASTATIN SODIUM 40 MG PO TABS
40.0000 mg | ORAL_TABLET | Freq: Every day | ORAL | Status: DC
  Administered 2021-02-23 – 2021-02-24 (×2): 40 mg via ORAL
  Filled 2021-02-23 (×2): qty 2
  Filled 2021-02-23 (×3): qty 1

## 2021-02-23 MED ORDER — IRBESARTAN 150 MG PO TABS
300.0000 mg | ORAL_TABLET | Freq: Every day | ORAL | Status: DC
  Administered 2021-02-24 – 2021-02-25 (×2): 300 mg via ORAL
  Filled 2021-02-23 (×2): qty 2

## 2021-02-23 MED ORDER — ONDANSETRON HCL 4 MG/2ML IJ SOLN
INTRAMUSCULAR | Status: DC | PRN
  Administered 2021-02-23: 4 mg via INTRAVENOUS

## 2021-02-23 MED ORDER — ONDANSETRON HCL 4 MG/2ML IJ SOLN: 4.0000 mg | Freq: Four times a day (QID) | INTRAMUSCULAR | Status: DC | PRN

## 2021-02-23 MED ORDER — BUPIVACAINE-EPINEPHRINE 0.25% -1:200000 IJ SOLN
INTRAMUSCULAR | Status: DC | PRN
  Administered 2021-02-23: 30 mL

## 2021-02-23 MED ORDER — SODIUM CHLORIDE 0.9 % IV SOLN
INTRAVENOUS | Status: DC | PRN
  Administered 2021-02-23: 20 ug/min via INTRAVENOUS

## 2021-02-23 MED ORDER — OXYCODONE HCL 5 MG PO TABS
10.0000 mg | ORAL_TABLET | ORAL | Status: DC | PRN
Start: 2021-02-23 — End: 2021-02-25
  Administered 2021-02-24: 10 mg via ORAL
  Filled 2021-02-23: qty 2

## 2021-02-23 MED ORDER — FENTANYL CITRATE (PF) 100 MCG/2ML IJ SOLN
25.0000 ug | INTRAMUSCULAR | Status: DC | PRN
  Administered 2021-02-23: 25 ug via INTRAVENOUS

## 2021-02-23 MED ORDER — FENTANYL CITRATE (PF) 100 MCG/2ML IJ SOLN
INTRAMUSCULAR | Status: AC
  Filled 2021-02-23: qty 2

## 2021-02-23 MED ORDER — MIDAZOLAM HCL 2 MG/2ML IJ SOLN
INTRAMUSCULAR | Status: AC
  Filled 2021-02-23: qty 2

## 2021-02-23 MED ORDER — PHENYLEPHRINE HCL (PRESSORS) 10 MG/ML IV SOLN
INTRAVENOUS | Status: DC | PRN
  Administered 2021-02-23 (×3): 100 ug via INTRAVENOUS
  Administered 2021-02-23: 150 ug via INTRAVENOUS
  Administered 2021-02-23: 100 ug via INTRAVENOUS

## 2021-02-23 MED ORDER — INSULIN ASPART 100 UNIT/ML IJ SOLN
0.0000 [IU] | Freq: Three times a day (TID) | INTRAMUSCULAR | Status: DC
  Administered 2021-02-23 – 2021-02-24 (×2): 2 [IU] via SUBCUTANEOUS
  Administered 2021-02-24: 3 [IU] via SUBCUTANEOUS
  Administered 2021-02-25 (×2): 2 [IU] via SUBCUTANEOUS
  Filled 2021-02-23 (×5): qty 1

## 2021-02-23 MED ORDER — SODIUM CHLORIDE 0.9 % IV SOLN: INTRAVENOUS | Status: DC

## 2021-02-23 MED ORDER — ACETAMINOPHEN 325 MG PO TABS
325.0000 mg | ORAL_TABLET | Freq: Four times a day (QID) | ORAL | Status: DC | PRN
  Administered 2021-02-25 (×2): 650 mg via ORAL
  Filled 2021-02-23 (×2): qty 2

## 2021-02-23 MED ORDER — HYDROCHLOROTHIAZIDE 12.5 MG PO CAPS
12.5000 mg | ORAL_CAPSULE | Freq: Every day | ORAL | Status: DC
  Administered 2021-02-24 – 2021-02-25 (×2): 12.5 mg via ORAL
  Filled 2021-02-23 (×2): qty 1

## 2021-02-23 MED ORDER — HYDROMORPHONE HCL 1 MG/ML IJ SOLN: 0.5000 mg | INTRAMUSCULAR | Status: DC | PRN

## 2021-02-23 MED ORDER — ORAL CARE MOUTH RINSE: 15.0000 mL | Freq: Once | OROMUCOSAL | Status: AC

## 2021-02-23 MED ORDER — PHENOL 1.4 % MT LIQD
1.0000 | OROMUCOSAL | Status: DC | PRN
  Filled 2021-02-23: qty 177

## 2021-02-23 MED ORDER — ZOLPIDEM TARTRATE 5 MG PO TABS: 5.0000 mg | ORAL_TABLET | Freq: Every evening | ORAL | Status: DC | PRN

## 2021-02-23 MED ORDER — BISACODYL 10 MG RE SUPP: 10.0000 mg | Freq: Every day | RECTAL | Status: DC | PRN

## 2021-02-23 MED ORDER — DOCUSATE SODIUM 100 MG PO CAPS
100.0000 mg | ORAL_CAPSULE | Freq: Two times a day (BID) | ORAL | Status: DC
  Administered 2021-02-23 – 2021-02-25 (×4): 100 mg via ORAL
  Filled 2021-02-23 (×4): qty 1

## 2021-02-23 MED ORDER — MIDAZOLAM HCL 5 MG/5ML IJ SOLN
INTRAMUSCULAR | Status: DC | PRN
  Administered 2021-02-23: 2 mg via INTRAVENOUS

## 2021-02-23 MED ORDER — PANTOPRAZOLE SODIUM 40 MG PO TBEC
40.0000 mg | DELAYED_RELEASE_TABLET | Freq: Every day | ORAL | Status: DC
  Administered 2021-02-23 – 2021-02-25 (×3): 40 mg via ORAL
  Filled 2021-02-23 (×3): qty 1

## 2021-02-23 MED ORDER — DIPHENHYDRAMINE HCL 12.5 MG/5ML PO ELIX
12.5000 mg | ORAL_SOLUTION | ORAL | Status: DC | PRN
Start: 2021-02-23 — End: 2021-02-25

## 2021-02-23 MED ORDER — ALUM & MAG HYDROXIDE-SIMETH 200-200-20 MG/5ML PO SUSP: 30.0000 mL | ORAL | Status: DC | PRN

## 2021-02-23 MED ORDER — MAGNESIUM CITRATE PO SOLN
1.0000 | Freq: Once | ORAL | Status: DC | PRN
  Filled 2021-02-23: qty 296

## 2021-02-23 SURGICAL SUPPLY — 64 items
APL PRP STRL LF DISP 70% ISPRP (MISCELLANEOUS) ×1
BLADE SAGITTAL AGGR TOOTH XLG (BLADE) ×2 IMPLANT
BNDG COHESIVE 6X5 TAN STRL LF (GAUZE/BANDAGES/DRESSINGS) ×6 IMPLANT
CANISTER SUCT 1200ML W/VALVE (MISCELLANEOUS) ×2 IMPLANT
CANISTER WOUND CARE 500ML ATS (WOUND CARE) ×2 IMPLANT
CHLORAPREP W/TINT 26 (MISCELLANEOUS) ×2 IMPLANT
COVER BACK TABLE REUSABLE LG (DRAPES) ×2 IMPLANT
COVER WAND RF STERILE (DRAPES) ×2 IMPLANT
DRAPE 3/4 80X56 (DRAPES) ×6 IMPLANT
DRAPE C-ARM XRAY 36X54 (DRAPES) ×2 IMPLANT
DRAPE INCISE IOBAN 66X60 STRL (DRAPES) IMPLANT
DRAPE POUCH INSTRU U-SHP 10X18 (DRAPES) ×2 IMPLANT
DRESSING SURGICEL FIBRLLR 1X2 (HEMOSTASIS) ×2 IMPLANT
DRSG MEPILEX SACRM 8.7X9.8 (GAUZE/BANDAGES/DRESSINGS) ×2 IMPLANT
DRSG OPSITE POSTOP 4X8 (GAUZE/BANDAGES/DRESSINGS) ×4 IMPLANT
DRSG SURGICEL FIBRILLAR 1X2 (HEMOSTASIS) ×4
ELECT BLADE 6.5 EXT (BLADE) ×2 IMPLANT
ELECT REM PT RETURN 9FT ADLT (ELECTROSURGICAL) ×2
ELECTRODE REM PT RTRN 9FT ADLT (ELECTROSURGICAL) ×1 IMPLANT
GLOVE SURG SYN 9.0  PF PI (GLOVE) ×4
GLOVE SURG SYN 9.0 PF PI (GLOVE) ×2 IMPLANT
GLOVE SURG UNDER POLY LF SZ9 (GLOVE) ×2 IMPLANT
GOWN SRG 2XL LVL 4 RGLN SLV (GOWNS) ×1 IMPLANT
GOWN STRL NON-REIN 2XL LVL4 (GOWNS) ×2
GOWN STRL REUS W/ TWL LRG LVL3 (GOWN DISPOSABLE) ×1 IMPLANT
GOWN STRL REUS W/TWL LRG LVL3 (GOWN DISPOSABLE) ×2
HEAD FEMORAL 28MM SZ M (Head) ×1 IMPLANT
HEMOVAC 400CC 10FR (MISCELLANEOUS) IMPLANT
HOLDER FOLEY CATH W/STRAP (MISCELLANEOUS) ×2 IMPLANT
HOOD PEEL AWAY FLYTE STAYCOOL (MISCELLANEOUS) ×2 IMPLANT
IRRIGATION SURGIPHOR STRL (IV SOLUTION) IMPLANT
KIT PREVENA INCISION MGT 13 (CANNISTER) ×2 IMPLANT
LINER DML 28MM HIGHCROSS (Liner) ×1 IMPLANT
MANIFOLD NEPTUNE II (INSTRUMENTS) ×2 IMPLANT
MASTERLOC HIP LATERAL S8 (Hips) ×1 IMPLANT
MAT ABSORB  FLUID 56X50 GRAY (MISCELLANEOUS) ×2
MAT ABSORB FLUID 56X50 GRAY (MISCELLANEOUS) ×1 IMPLANT
NDL SAFETY ECLIPSE 18X1.5 (NEEDLE) ×1 IMPLANT
NDL SPNL 20GX3.5 QUINCKE YW (NEEDLE) ×2 IMPLANT
NEEDLE HYPO 18GX1.5 SHARP (NEEDLE) ×2
NEEDLE SPNL 20GX3.5 QUINCKE YW (NEEDLE) ×4 IMPLANT
NS IRRIG 1000ML POUR BTL (IV SOLUTION) ×2 IMPLANT
PACK HIP COMPR (MISCELLANEOUS) ×2 IMPLANT
SCALPEL PROTECTED #10 DISP (BLADE) ×4 IMPLANT
SEALER BIPOLAR AQUA 6.0 (INSTRUMENTS) ×1 IMPLANT
SHELL ACETABULAR DM  60MM (Shell) ×1 IMPLANT
SOL PREP PVP 2OZ (MISCELLANEOUS) ×2
SOLUTION PREP PVP 2OZ (MISCELLANEOUS) ×1 IMPLANT
SPONGE DRAIN TRACH 4X4 STRL 2S (GAUZE/BANDAGES/DRESSINGS) ×2 IMPLANT
STAPLER SKIN PROX 35W (STAPLE) ×2 IMPLANT
STRAP SAFETY 5IN WIDE (MISCELLANEOUS) ×2 IMPLANT
SUT DVC 2 QUILL PDO  T11 36X36 (SUTURE) ×2
SUT DVC 2 QUILL PDO T11 36X36 (SUTURE) ×1 IMPLANT
SUT SILK 0 (SUTURE) ×2
SUT SILK 0 30XBRD TIE 6 (SUTURE) ×1 IMPLANT
SUT V-LOC 90 ABS DVC 3-0 CL (SUTURE) ×2 IMPLANT
SUT VIC AB 1 CT1 36 (SUTURE) ×2 IMPLANT
SYR 20ML LL LF (SYRINGE) ×2 IMPLANT
SYR 30ML LL (SYRINGE) ×2 IMPLANT
SYR 50ML LL SCALE MARK (SYRINGE) ×4 IMPLANT
SYR BULB IRRIG 60ML STRL (SYRINGE) ×2 IMPLANT
TAPE MICROFOAM 4IN (TAPE) ×2 IMPLANT
TOWEL OR 17X26 4PK STRL BLUE (TOWEL DISPOSABLE) ×2 IMPLANT
TRAY FOLEY MTR SLVR 16FR STAT (SET/KITS/TRAYS/PACK) ×2 IMPLANT

## 2021-02-23 NOTE — Op Note (Signed)
02/23/2021  3:07 PM  PATIENT:  Jerome Cunningham  65 y.o. male  PRE-OPERATIVE DIAGNOSIS:  Primary localized osteoarthritis of left hip M16.12 Acute pain of left hip M25.552  POST-OPERATIVE DIAGNOSIS:  Primary localized osteoarthritis of left hip M16.12 Acute pain of left hip M25.552  PROCEDURE:  Procedure(s): TOTAL HIP ARTHROPLASTY ANTERIOR APPROACH (Left)  SURGEON: Leitha Schuller, MD  ASSISTANTS: None  ANESTHESIA:   spinal  EBL:  Total I/O In: 1050 [I.V.:1000; IV Piggyback:50] Out: 1300 [Urine:800; Blood:500]  BLOOD ADMINISTERED:none  DRAINS: Incisional wound VAC   LOCAL MEDICATIONS USED:  MARCAINE    and OTHER Exparel  SPECIMEN:  Source of Specimen:  Left femoral head  DISPOSITION OF SPECIMEN:  PATHOLOGY  COUNTS:  YES  TOURNIQUET:  * No tourniquets in log *  IMPLANTS: Medacta Master lock 8 lateralized stem with 60 mm Mpact DM cup and liner with ceramic M head  DICTATION: .Dragon Dictation   The patient was brought to the operating room and after spinal anesthesia was obtained patient was placed on the operative table with the ipsilateral foot into the Medacta attachment, contralateral leg on a well-padded table. C-arm was brought in and preop template x-ray taken. After prepping and draping in usual sterile fashion appropriate patient identification and timeout procedures were completed. Anterior approach to the hip was obtained and centered over the greater trochanter and TFL muscle. The subcutaneous tissue was incised hemostasis being achieved by electrocautery. TFL fascia was incised and the muscle retracted laterally deep retractor placed. The lateral femoral circumflex vessels were identified and ligated. The anterior capsule was exposed and a capsulotomy performed. The neck was identified and a femoral neck cut carried out with a saw. The head was removed with some difficulty and showed sclerotic femoral head and acetabulum. Reaming was carried out to 60 mm and a 60  mm cup trial gave appropriate tightness to the acetabular component a 60 DM cup was impacted into position. The leg was then externally rotated and ischiofemoral and pubofemoral releases carried out. The femur was sequentially broached to a size 8, size 8 lateralized with S and L trials were placed and the final components chosen. The 8 lateralized Master lock w stem was inserted along with a ceramic M 28 mm head and 60 mm liner. The hip was reduced and was stable the wound was thoroughly irrigated with fibrillar placed along the posterior capsule and medial neck. The deep fascia ws closed using a heavy Quill after infiltration of 30 cc of quarter percent Sensorcaine with epinephrine with Exparel throughout the case .3-0 V-loc to close the skin with skin staples.  Incisional wound VAC applied and patient was sent to recovery in stable condition.   PLAN OF CARE: Admit to inpatient

## 2021-02-23 NOTE — H&P (Signed)
Chief Complaint  Patient presents with  . Pre-op Exam  Left THA scheduled 02/23/21    History of the Present Illness: Jerome Cunningham is a 65 y.o. male here today for history and physical for left total hip arthroplasty with Dr. Kennedy Bucker on 02/23/2021. Patient has had progressive left hip pain for 6 months. He has x-ray showing severe degenerative changes of the left hip joint with complete loss of joint space throughout the entire joint with a large cam lesion and subchondral cyst formation. Patient ambulates with a cane. Pain is located along the groin and lateral hip. Pain is interfering with quality of life and activities daily living. He is tried over-the-counter anti-inflammatory medications and Tylenol with no relief. Pain is 5 out of 10. He is very stiff. He has a hard time putting on socks and shoes, getting in and out of the car and going up steps due to hip pain.  I have reviewed past medical, surgical, social and family history, and allergies as documented in the EMR.  Past Medical History: Past Medical History:  Diagnosis Date  . Borderline diabetes mellitus  . Dermatitis 2-18  . Diabetes mellitus type 2, uncomplicated (CMS-HCC) 2-18  . History of diverticulitis  . Hypertension, essential  . Low testosterone  . Obesity  . 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 Pure hypercholesterolemia (LDL 105 - 10/15/19) 10/29/2019  . Sinusitis, unspecified 1985  . Sleep apnea  on CPAP  . Tobacco dependence  . Tobacco dependence (3/4 ppd)   Past Surgical History: Past Surgical History:  Procedure Laterality Date  . COLONOSCOPY 03/12/2018  Diverticulosis/Otherwise normal colon/Repeat 22yrs/TKT  . colonoscopy 2010  . JOINT REPLACEMENT 01/2016  Rh hip  . left hip surgery  . Nasal surgery to correct deviated septum.  . TONSILLECTOMY june 1972  . Total hip arthroplasty anterior approach Right 02/14/2016  Dr.Jeovani Weisenburger   Past Family History: Family History  Problem Relation Age of Onset   . High blood pressure (Hypertension) Mother  . Alcohol abuse Mother  . Stroke Mother  . Stroke Father  . Obesity Father  . No Known Problems Sister  . No Known Problems Sister   Medications: Current Outpatient Medications Ordered in Epic  Medication Sig Dispense Refill  . acetaminophen (TYLENOL) 500 MG tablet Take 1,000 mg by mouth continuously as needed for Pain  . aspirin 81 MG EC tablet Take 81 mg by mouth once daily as needed  . aspirin-acetaminophen-caffeine (EXCEDRIN MIGRAINE) 250-250-65 mg per tablet Take 1 tablet by mouth once daily as needed for Pain.  . blood glucose diagnostic test strip Use 1 strip once daily Use as instructed. Checks blood sugar occasionally ACCU-CHEK  . dulaglutide (TRULICITY) 0.75 mg/0.5 mL pen injector Inject 0.5 mLs (0.75 mg total) subcutaneously once a week for 30 days 8 mL 1  . hydroCHLOROthiazide (HYDRODIURIL) 12.5 MG tablet Take 1 tablet (12.5 mg total) by mouth once daily 90 tablet 1  . ibuprofen (MOTRIN) 800 MG tablet Take 800 mg by mouth every 6 (six) hours as needed for Pain  . lancets (ONETOUCH DELICA LANCETS) 30 gauge Misc Use 1 each as directed. Check CBG's fasting once daily. Dx: E11.9 50 each 11  . losartan (COZAAR) 100 MG tablet Take 1 tablet by mouth once daily  . lovastatin (MEVACOR) 20 MG tablet TAKE 1 TABLET NIGHTLY 90 tablet 3  . metFORMIN (GLUCOPHAGE) 1000 MG tablet TAKE 1 TABLET TWICE A DAY WITH MEALS 180 tablet 3  . psyllium husk, with sugar, (  METAMUCIL) 3.4 gram/12 gram oral powder Take 3.4 g by mouth 2 (two) times daily as needed Mix with a full glass (240 mL) of fluid.  . tamsulosin (FLOMAX) 0.4 mg capsule  . telmisartan (MICARDIS) 80 MG tablet TAKE 1 TABLET DAILY 90 tablet 1   No current Epic-ordered facility-administered medications on file.   Allergies: Allergies  Allergen Reactions  . Tramadol Other (See Comments)  Increase in pain- per patient    Body mass index is 50.91 kg/m.  Review of Systems: A comprehensive  14 point ROS was performed, reviewed, and the pertinent orthopaedic findings are documented in the HPI.  Vitals:  02/13/21 0858  BP: (!) 160/84    General Physical Examination:   General:  Well developed, well nourished, no apparent distress, normal affect, antalgic gait with a cane.  HEENT: Head normocephalic, atraumatic, PERRL.   Abdomen: Soft, non tender, non distended, Bowel sounds present.  Heart: Examination of the heart reveals regular, rate, and rhythm. There is no murmur noted on ascultation. There is a normal apical pulse.  Lungs: Lungs are clear to auscultation. There is no wheeze, rhonchi, or crackles. There is normal expansion of bilateral chest walls.   Musculoskeletal Examination: On exam, left hip has 20 degrees external rotation and 30 degrees internal. Ambulates with the left leg externally rotated. He ambulates with a cane with antalgic gait.  Radiographs:  AP pelvis and lateral x-rays of the left hip were ordered and personally reviewed today. These show prior right total hip with good position. Left hip has severe osteoarthritis, complete loss of joint space with extensive osteophytes. No bone loss in the acetabulum. Has the appearance of possibly having had Perthes as a child. Lateral view confirms bone-on-bone osteoarthritis, subchondral cyst and subchondral sclerosis.  X-ray Impression Severe left hip osteoarthritis.  Assessment: ICD-10-CM  1. Primary localized osteoarthritis of left hip M16.12   Plan: 5. 65 year old male with advanced left hip osteoarthritis. Pain interfering with quality of life and activities daily living. He ambulates with a cane. No relief with over-the-counter medications. Mobility extremely limited. Risks, benefits, complications of a left total hip arthroplasty have been discussed with the patient. Patient has agreed and consented procedure with Dr. Kennedy Bucker on 02/23/2021.  Electronically signed by Patience Musca,  PA at 02/13/2021 10:43 AM EDT  Reviewed  H+P. No changes noted.

## 2021-02-23 NOTE — Transfer of Care (Signed)
Immediate Anesthesia Transfer of Care Note  Patient: Jerome Cunningham  Procedure(s) Performed: TOTAL HIP ARTHROPLASTY ANTERIOR APPROACH (Left Hip)  Patient Location: PACU  Anesthesia Type:General  Level of Consciousness: awake, alert  and oriented  Airway & Oxygen Therapy: Patient Spontanous Breathing  Post-op Assessment: Report given to RN and Post -op Vital signs reviewed and stable  Post vital signs: Reviewed and stable  Last Vitals:  Vitals Value Taken Time  BP    Temp    Pulse    Resp    SpO2      Last Pain:  Vitals:   02/23/21 1047  TempSrc: Tympanic  PainSc: 0-No pain         Complications: No complications documented.

## 2021-02-23 NOTE — Anesthesia Preprocedure Evaluation (Signed)
Anesthesia Evaluation  Patient identified by MRN, date of birth, ID band Patient awake    Reviewed: Allergy & Precautions, H&P , NPO status , Patient's Chart, lab work & pertinent test results, reviewed documented beta blocker date and time   Airway Mallampati: II   Neck ROM: full    Dental  (+) Poor Dentition   Pulmonary sleep apnea and Continuous Positive Airway Pressure Ventilation , COPD, Current SmokerPatient did not abstain from smoking.,    Pulmonary exam normal        Cardiovascular Exercise Tolerance: Poor hypertension, On Medications negative cardio ROS Normal cardiovascular exam Rhythm:regular Rate:Normal     Neuro/Psych negative neurological ROS  negative psych ROS   GI/Hepatic negative GI ROS, Neg liver ROS,   Endo/Other  diabetes, Type 2, Oral Hypoglycemic AgentsMorbid obesity  Renal/GU negative Renal ROS  negative genitourinary   Musculoskeletal   Abdominal   Peds  Hematology negative hematology ROS (+)   Anesthesia Other Findings Past Medical History: No date: Arthritis     Comment:  hands, knees and hips No date: COPD (chronic obstructive pulmonary disease) (HCC)     Comment:  mild per dr. Mayo Ao No date: Diabetes mellitus     Comment:  diet control No date: Diverticulitis No date: History of kidney stones No date: Hypercholesteremia No date: Hypertension 01/2021: Inguinal hernia     Comment:  left No date: Sleep apnea     Comment:  uses CPAP No date: Tobacco use disorder     Comment:  attempting to cut down on his own Past Surgical History: 03/12/2018: COLONOSCOPY WITH PROPOFOL; N/A     Comment:  Procedure: COLONOSCOPY WITH PROPOFOL;  Surgeon: [place],               Boykin Nearing, MD;  Location: [facility];  Service:               Gastroenterology;  Laterality: N/A; No date: HIP SURGERY     Comment:  as a teenager. pins and plates removed 12/07/1759: HYDROCELE EXCISION; Left      Comment:  Procedure: HYDROCELECTOMY ADULT;  Surgeon: Riki Altes, MD;  Location: [facility] OR[facility];  Service: Urology;                Laterality: Left; 02/14/2016: JOINT REPLACEMENT; Right     Comment:  hip No date: NASAL SEPTUM SURGERY No date: TONSILLECTOMY 02/14/2016: TOTAL HIP ARTHROPLASTY; Right     Comment:  Procedure: TOTAL HIP ARTHROPLASTY ANTERIOR APPROACH;                Surgeon: Kennedy Bucker, MD;  Location: [facility] ORS;  Service:              Orthopedics;  Laterality: Right; BMI    Body Mass Index: 48.86 kg/m     Reproductive/Obstetrics negative OB ROS                             Anesthesia Physical Anesthesia Plan  ASA: III  Anesthesia Plan: Spinal   Post-op Pain Management:    Induction:   PONV Risk Score and Plan:   Airway Management Planned:   Additional Equipment:   Intra-op Plan:   Post-operative Plan:   Informed Consent: I have reviewed the patients History and Physical, chart, labs and discussed the procedure including the risks, benefits and alternatives for the proposed anesthesia  with the patient or authorized representative who has indicated his/her understanding and acceptance.     Dental Advisory Given  Plan Discussed with: CRNA  Anesthesia Plan Comments:         Anesthesia Quick Evaluation

## 2021-02-23 NOTE — Plan of Care (Signed)

## 2021-02-24 ENCOUNTER — Encounter: Payer: Self-pay | Admitting: Orthopedic Surgery

## 2021-02-24 LAB — CBC
HCT: 37.2 % — ABNORMAL LOW (ref 39.0–52.0)
Hemoglobin: 12.8 g/dL — ABNORMAL LOW (ref 13.0–17.0)
MCH: 29.2 pg (ref 26.0–34.0)
MCHC: 34.4 g/dL (ref 30.0–36.0)
MCV: 84.7 fL (ref 80.0–100.0)
Platelets: 255 10*3/uL (ref 150–400)
RBC: 4.39 MIL/uL (ref 4.22–5.81)
RDW: 13.5 % (ref 11.5–15.5)
WBC: 10.7 10*3/uL — ABNORMAL HIGH (ref 4.0–10.5)
nRBC: 0 % (ref 0.0–0.2)

## 2021-02-24 LAB — URINALYSIS, ROUTINE W REFLEX MICROSCOPIC
Bilirubin Urine: NEGATIVE
Glucose, UA: NEGATIVE mg/dL
Ketones, ur: NEGATIVE mg/dL
Leukocytes,Ua: NEGATIVE
Nitrite: NEGATIVE
Protein, ur: NEGATIVE mg/dL
Specific Gravity, Urine: 1.005 (ref 1.005–1.030)
pH: 6 (ref 5.0–8.0)

## 2021-02-24 LAB — GLUCOSE, CAPILLARY
Glucose-Capillary: 109 mg/dL — ABNORMAL HIGH (ref 70–99)
Glucose-Capillary: 187 mg/dL — ABNORMAL HIGH (ref 70–99)
Glucose-Capillary: 127 mg/dL — ABNORMAL HIGH (ref 70–99)
Glucose-Capillary: 134 mg/dL — ABNORMAL HIGH (ref 70–99)

## 2021-02-24 LAB — BASIC METABOLIC PANEL
Anion gap: 6 (ref 5–15)
BUN: 14 mg/dL (ref 8–23)
CO2: 23 mmol/L (ref 22–32)
Calcium: 8.5 mg/dL — ABNORMAL LOW (ref 8.9–10.3)
Chloride: 107 mmol/L (ref 98–111)
Creatinine, Ser: 0.72 mg/dL (ref 0.61–1.24)
GFR, Estimated: >60 mL/min (ref >60)
Glucose, Bld: 128 mg/dL — ABNORMAL HIGH (ref 70–99)
Potassium: 4.1 mmol/L (ref 3.5–5.1)
Sodium: 136 mmol/L (ref 135–145)

## 2021-02-24 MED ORDER — METHOCARBAMOL 500 MG PO TABS: 500.0000 mg | ORAL_TABLET | Freq: Four times a day (QID) | ORAL | 0 refills | Status: AC | PRN

## 2021-02-24 MED ORDER — DOCUSATE SODIUM 100 MG PO CAPS: 100.0000 mg | ORAL_CAPSULE | Freq: Two times a day (BID) | ORAL | 0 refills | Status: AC

## 2021-02-24 MED ORDER — OXYCODONE HCL 5 MG PO TABS: 5.0000 mg | ORAL_TABLET | ORAL | 0 refills | Status: AC | PRN

## 2021-02-24 MED ORDER — ENOXAPARIN SODIUM 40 MG/0.4ML IJ SOSY: 40.0000 mg | PREFILLED_SYRINGE | INTRAMUSCULAR | 0 refills | Status: AC

## 2021-02-24 NOTE — Anesthesia Postprocedure Evaluation (Signed)
Anesthesia Post Note  Patient: Jerome Cunningham  Procedure(s) Performed: TOTAL HIP ARTHROPLASTY ANTERIOR APPROACH (Left Hip)  Patient location during evaluation: Nursing Unit Anesthesia Type: Spinal Level of consciousness: oriented and awake and alert Pain management: pain level controlled Vital Signs Assessment: post-procedure vital signs reviewed and stable Respiratory status: spontaneous breathing and respiratory function stable Cardiovascular status: blood pressure returned to baseline and stable Postop Assessment: no headache, no backache, no apparent nausea or vomiting and patient able to bend at knees Anesthetic complications: no   No complications documented.   Last Vitals:  Vitals:   02/23/21 2053 02/24/21 0503  BP: 129/76 111/68  Pulse: (!) 103 83  Resp: 18 17  Temp: 36.8 C 36.6 C  SpO2: 94% 97%    Last Pain:  Vitals:   02/24/21 0503  TempSrc: Oral  PainSc:                  Jeanine Luz

## 2021-02-24 NOTE — Discharge Instructions (Signed)

## 2021-02-24 NOTE — Progress Notes (Signed)
Physical Therapy Treatment Patient Details Name: Jerome Cunningham MRN: [id number] DOB: 05/29/1956 Today's Date: 02/24/2021    History of Present Illness Pt is a 65 y.o. male s/p L THA ant approach without any complications. PMH includes: DMII, HTN, obesity, sleep apnea.    PT Comments    Pt found seated in recliner with family present. Agreeable to PT services. Pt still requires bed rails and HOB elevated in order to sit EOB only requiring PT supervision. Pt STS with Mod-I to BRW and amb total of 70' to/from room and PT gym. Pt educated on side stepping step to pattern with use of R railing. Being L THA, and access only to R hand rail. Pt slightly quarter turned and asc with RLE followed by LLE safely with PT Minguard for safety. Pt safely desc with normal step to pattern with R railing. Pt then educated on BRW sequencing and Asc/Desc curb to mimic pt getting into/out of home from porch having a small step he has to navigate. Pt safe and displays good understanding of sequencing of RW and LE's with MinGuard. Pt returned to room and transferred supine into bed requiring ModA for LLE management due to pain. Pt's son educated on body mechanics and how to assist pt's LLE getting into bed. Pt given Ant approach THA handout and performed exercises still requiring AAROM with SLR's but displays ability to perform heel slides actively. PT set up pt's personal BRW to appropriate height for future mobility needs. Currently D/c recs remain appropriate at this time. PT will continue to f/u with pt per POC.      Follow Up Recommendations  Home health PT;Supervision for mobility/OOB     Equipment Recommendations  Other (comment) (Bariatric RW)    Recommendations for Other Services       Precautions / Restrictions Precautions Precautions: Anterior Hip Precaution Booklet Issued: No Restrictions Weight Bearing Restrictions: Yes LLE Weight Bearing: Weight bearing as tolerated    Mobility  Bed  Mobility Overal bed mobility: Needs Assistance Bed Mobility: Rolling;Sidelying to Sit;Supine to Sit Rolling: Modified independent (Device/Increase time) Sidelying to sit: Modified independent (Device/Increase time);HOB elevated Supine to sit: Supervision     General bed mobility comments: Pt received in chair and returned to bed    Transfers Overall transfer level: Needs assistance Equipment used: Rolling walker (2 wheeled) Transfers: Sit to/from Stand Sit to Stand: Modified independent (Device/Increase time)         General transfer comment: assist for eccentric control from low chair height  Ambulation/Gait Ambulation/Gait assistance: Modified independent (Device/Increase time) Gait Distance (Feet): 70 Feet Assistive device:  (Bariatric RW) Gait Pattern/deviations: Step-through pattern;Decreased stance time - left     General Gait Details: Decreased cadence   Stairs             Wheelchair Mobility    Modified Rankin (Stroke Patients Only)       Balance Overall balance assessment: Needs assistance Sitting-balance support: No upper extremity supported Sitting balance-Leahy Scale: Good     Standing balance support: Bilateral upper extremity supported Standing balance-Leahy Scale: Fair Standing balance comment: reaching inside BOS                            Cognition Arousal/Alertness: Awake/alert Behavior During Therapy: WFL for tasks assessed/performed Overall Cognitive Status: Within Functional Limits for tasks assessed  Exercises Total Joint Exercises Ankle Circles/Pumps: AROM;Both;10 reps;Supine Quad Sets: AROM;Strengthening;Left;10 reps Gluteal Sets: AROM;Left;10 reps Short Arc Quad: AROM;Strengthening;Left;Supine;10 reps Heel Slides: AROM;Strengthening;Left;10 reps Hip ABduction/ADduction: AROM;Strengthening;Left;10 reps Straight Leg Raises: AAROM;Strengthening;Left;10  reps Other Exercises: Pt asc/desc 4 stairs sideways step to, asc/desc curb all with BRW   General Comments        Pertinent Vitals/Pain Pain Assessment: Faces Pain Score: 3  Faces Pain Scale: Hurts little more Pain Location: L hip incision Pain Descriptors / Indicators: Aching Pain Intervention(s): Limited activity within patient's tolerance;Monitored during session;Repositioned    Home Living Family/patient expects to be discharged to:: Private residence Living Arrangements: Alone Available Help at Discharge: Family;Available 24 hours/day (son 24/7 first week) Type of Home: House Home Access: Stairs to enter Entrance Stairs-Rails: Right Home Layout: One level Home Equipment: Bedside commode;Grab bars - tub/shower;Shower seat;Cane - single point;Toilet riser Additional Comments: Rented RW after R THA. Wishes to Rent RW again once D/c    Prior Function Level of Independence: Independent      Comments: Independent with all functional mobility, ADL's/IADLs.   PT Goals (current goals can now be found in the care plan section) Acute Rehab PT Goals Patient Stated Goal: Go home PT Goal Formulation: With patient Time For Goal Achievement: 02/26/21 Potential to Achieve Goals: Good Progress towards PT goals: Progressing toward goals    Frequency    BID      PT Plan Current plan remains appropriate    Co-evaluation              AM-PAC PT "6 Clicks" Mobility   Outcome Measure  Help needed turning from your back to your side while in a flat bed without using bedrails?: A Lot Help needed moving from lying on your back to sitting on the side of a flat bed without using bedrails?: A Little Help needed moving to and from a bed to a chair (including a wheelchair)?: A Little Help needed standing up from a chair using your arms (e.g., wheelchair or bedside chair)?: A Little Help needed to walk in hospital room?: A Little Help needed climbing 3-5 steps with a railing? : A  Lot 6 Click Score: 16    End of Session Equipment Utilized During Treatment: Gait belt Activity Tolerance: Patient tolerated treatment well Patient left: in bed;with call bell/phone within reach;with SCD's reapplied;with family/visitor present Nurse Communication: Mobility status PT Visit Diagnosis: Pain;Other abnormalities of gait and mobility (R26.89);Muscle weakness (generalized) (M62.81);Difficulty in walking, not elsewhere classified (R26.2) Pain - Right/Left: Left Pain - part of body: Hip     Time: 2130-8657 PT Time Calculation (min) (ACUTE ONLY): 32 min  Charges:  $Gait Training: 8-22 mins $Therapeutic Exercise: 8-22 mins                      Onyx Schirmer M. Fairly IV, PT, DPT Physical Therapist- [place]  [facility]  02/24/2021, 2:34 PM

## 2021-02-24 NOTE — Discharge Summary (Signed)
Physician Discharge Summary  Patient ID: Jerome Cunningham MRN: [id number] DOB/AGE: Jan 28, 1956 65 y.o.  Admit date: 02/23/2021 Discharge date: 02/25/2021  Admission Diagnoses:  S/P hip replacement [Z96.649]  Discharge Diagnoses: Patient Active Problem List   Diagnosis Date Noted  . S/P hip replacement 02/23/2021  . Fever 08/07/2019  . Severe sepsis (HCC) 08/07/2019  . ESBL (extended spectrum beta-lactamase) producing bacteria infection 08/07/2019  . Uncontrolled hypertension 08/07/2019  . Tobacco abuse 08/07/2019  . Type 2 diabetes mellitus with obesity (HCC) 04/10/2019  . OSA on CPAP 04/03/2018  . Diabetes mellitus without complication (HCC) 12/09/2017  . Hypertension, essential 12/09/2017  . Low testosterone 12/09/2017  . Morbid obesity due to excess calories (HCC) 12/09/2017  . Osteoarthritis 12/09/2017  . Tobacco dependence 12/09/2017  . Hydrocele 11/26/2017  . History of normocytic normochromic anemia 04/19/2017  . Primary osteoarthritis of right hip 02/14/2016  . Vaccine counseling 11/15/2014    Past Medical History:  Diagnosis Date  . Arthritis    hands, knees and hips  . COPD (chronic obstructive pulmonary disease) (HCC)    mild per dr. Mayo Ao  . Diabetes mellitus    diet control  . Diverticulitis   . History of kidney stones   . Hypercholesteremia   . Hypertension   . Inguinal hernia 01/2021   left  . Sleep apnea    uses CPAP  . Tobacco use disorder    attempting to cut down on his own     Transfusion: none   Consultants (if any):   Discharged Condition: Improved  Hospital Course: Jerome Cunningham is an 65 y.o. male who was admitted 02/23/2021 with a diagnosis of left hip osteoarthritis and went to the operating room on 02/23/2021 and underwent the above named procedures.    Surgeries: Procedure(s): TOTAL HIP ARTHROPLASTY ANTERIOR APPROACH on 02/23/2021 Patient tolerated the surgery well. Taken to PACU where she was stabilized and then  transferred to the orthopedic floor.  Started on Lovenox 40 mg q 24 hrs. Foot pumps applied bilaterally at 80 mm. Heels elevated on bed with rolled towels. No evidence of DVT. Negative Homan. Physical therapy started on day #1 for gait training and transfer. OT started day #1 for ADL and assisted devices.  Patient's foley was d/c on day #1. Patient's IV was d/c on day #1.  On post op day #2 patient was stable and ready for discharge to home with HHPT.  Implants: Medacta Master lock 8 lateralized stem with 60 mm Mpact DM cup and liner with ceramic M head  He was given perioperative antibiotics:  Anti-infectives (From admission, onward)   Start     Dose/Rate Route Frequency Ordered Stop   02/23/21 1100  ceFAZolin (ANCEF) IVPB 3g/100 mL premix  Status:  Discontinued        3 g 200 mL/hr over 30 Minutes Intravenous On call to O.R. 02/23/21 1050 02/23/21 1050   02/23/21 1100  ceFAZolin (ANCEF) 3 g in dextrose 5 % 50 mL IVPB        3 g 100 mL/hr over 30 Minutes Intravenous On call to O.R. 02/23/21 1051 02/23/21 1302   02/23/21 1045  ceFAZolin (ANCEF) IVPB 3g/100 mL premix  Status:  Discontinued        3 g 200 mL/hr over 30 Minutes Intravenous On call to O.R. 02/23/21 1041 02/23/21 1051    .  He was given sequential compression devices, early ambulation, and Lovenox, teds for DVT prophylaxis.  He benefited maximally from the hospital stay  and there were no complications.    Recent vital signs:  Vitals:   02/25/21 0311 02/25/21 0822  BP: 135/74 134/75  Pulse: (!) 106 96  Resp: 18 16  Temp: 98.9 F (37.2 C) 98.1 F (36.7 C)  SpO2: 92% 94%    Recent laboratory studies:  Lab Results  Component Value Date   HGB 12.8 (L) 02/25/2021   HGB 12.8 (L) 02/24/2021   HGB 12.9 (L) 02/23/2021   Lab Results  Component Value Date   WBC 12.7 (H) 02/25/2021   PLT 245 02/25/2021   Lab Results  Component Value Date   INR 0.96 02/01/2016   Lab Results  Component Value Date   NA 136  02/24/2021   K 4.1 02/24/2021   CL 107 02/24/2021   CO2 23 02/24/2021   BUN 14 02/24/2021   CREATININE 0.72 02/24/2021   GLUCOSE 128 (H) 02/24/2021    Discharge Medications:   Allergies as of 02/25/2021      Reactions   Tramadol Other (See Comments)   "increases pain"      Medication List    STOP taking these medications   acetaminophen 500 MG tablet Commonly known as: TYLENOL   aspirin-acetaminophen-caffeine 250-250-65 MG tablet Commonly known as: EXCEDRIN MIGRAINE     TAKE these medications   docusate sodium 100 MG capsule Commonly known as: COLACE Take 1 capsule (100 mg total) by mouth 2 (two) times daily.   enoxaparin 40 MG/0.4ML injection Commonly known as: LOVENOX Inject 0.4 mLs (40 mg total) into the skin daily for 14 days.   hydrochlorothiazide 12.5 MG capsule Commonly known as: MICROZIDE Take 12.5 mg by mouth daily.   lovastatin 20 MG tablet Commonly known as: MEVACOR Take 20 mg by mouth at bedtime.   metFORMIN 1000 MG tablet Commonly known as: GLUCOPHAGE Take 1,000 mg by mouth 2 (two) times daily with a meal.   methocarbamol 500 MG tablet Commonly known as: ROBAXIN Take 1 tablet (500 mg total) by mouth every 6 (six) hours as needed for muscle spasms.   oxyCODONE 5 MG immediate release tablet Commonly known as: Oxy IR/ROXICODONE Take 1-2 tablets (5-10 mg total) by mouth every 4 (four) hours as needed for moderate pain (pain score 4-6).   telmisartan 80 MG tablet Commonly known as: MICARDIS Take 80 mg by mouth daily.   Trulicity 0.75 MG/0.5ML Sopn Generic drug: Dulaglutide Inject 0.75 mg into the skin every Thursday.            Durable Medical Equipment  (From admission, onward)         Start     Ordered   02/23/21 1652  DME Walker rolling  Once       Question Answer Comment  Walker: With 5 Inch Wheels   Patient needs a walker to treat with the following condition S/P hip replacement      02/23/21 1651   02/23/21 1652  DME 3 n 1   Once        02/23/21 1651   02/23/21 1652  DME Bedside commode  Once       Question:  Patient needs a bedside commode to treat with the following condition  Answer:  S/P hip replacement   02/23/21 1651         Diagnostic Studies: DG HIP OPERATIVE UNILAT WITH PELVIS LEFT  Result Date: 02/23/2021 CLINICAL DATA:  Left anterior hip replacement EXAM: OPERATIVE LEFT HIP (WITH PELVIS IF PERFORMED)  VIEWS TECHNIQUE: Fluoroscopic spot image(s) were submitted  for interpretation post-operatively. COMPARISON:  CT renal stone protocol 09/29/2019. FINDINGS: Fluoro time: 48 seconds. Two C-arm fluoroscopic cines were obtained intraoperatively and submitted for post operative interpretation. The first demonstrates the hip prior to intervention. The second demonstrates postsurgical changes total hip arthroplasty, partially imaged. No unexpected findings on these cines. Please see the performing provider's procedural report for further detail. IMPRESSION: Left total hip arthroplasty. Electronically Signed   By: Feliberto Harts MD   On: 02/23/2021 15:10   DG HIP UNILAT W OR W/O PELVIS 2-3 VIEWS LEFT  Result Date: 02/23/2021 CLINICAL DATA:  Postop hip replacement. EXAM: DG HIP (WITH OR WITHOUT PELVIS) 2-3V LEFT COMPARISON:  None. FINDINGS: Two view exam of the left hip shows hip prosthesis in situ. No evidence for immediate hardware complication. Gas in the soft tissues and skin staples are consistent with the immediate postoperative state. Presumed surgical drain overlies the lateral hip. IMPRESSION: Status post left hip replacement without evidence for immediate hardware complication. Electronically Signed   By: Kennith Center M.D.   On: 02/23/2021 16:00   Disposition: Plan for discharge home today following a BM.   Follow-up Information    Evon Slack, PA-C Follow up in 2 week(s).   Specialties: Orthopedic Surgery, Emergency Medicine Contact information: [address] [facility] [place] [facility]  [address] [place] [address] [phone]              Signed: Meriel Pica PA-C 02/25/2021, 9:01 AM

## 2021-02-24 NOTE — Evaluation (Signed)
Occupational Therapy Evaluation Patient Details Name: Jerome Cunningham MRN: [id number] DOB: 1956-05-15 Today's Date: 02/24/2021    History of Present Illness Pt is a 65 y.o. male s/p L THA ant approach without any complications. PMH includes: DMII, HTN, obesity, sleep apnea.   Clinical Impression   [name] was seen for OT evaluation this date. Prior to hospital admission, pt was Independent for mobility and ADLs. Pt lives alone in home c 2 STE, plans to have son stay with him 24/7 first week after discharge. Pt presents to acute OT demonstrating impaired ADL performance and functional mobility 2/2 functional strength/endurance deficits and decreased LB access. Pt currently requires MOD A for LB access seated EOC. MIN A + RW for ADL t/f. SBA + single UE support functional reach inside BOS. Pt would benefit from skilled OT to address noted impairments and functional limitations (see below for any additional details) in order to maximize safety and independence while minimizing falls risk and caregiver burden. Upon hospital discharge, recommend no OT follow up needed.      Follow Up Recommendations  No OT follow up    Equipment Recommendations  None recommended by OT    Recommendations for Other Services       Precautions / Restrictions Precautions Precautions: Anterior Hip Precaution Booklet Issued: No Restrictions Weight Bearing Restrictions: Yes LLE Weight Bearing: Weight bearing as tolerated      Mobility Bed Mobility Overal bed mobility: Needs Assistance     General bed mobility comments: pt received and left in chair    Transfers Overall transfer level: Needs assistance Equipment used: Rolling walker (2 wheeled) Transfers: Sit to/from Stand Sit to Stand: Min assist         General transfer comment: assist for eccentric control from low chair height    Balance Overall balance assessment: Needs assistance Sitting-balance support: No upper extremity  supported Sitting balance-Leahy Scale: Good     Standing balance support: Single extremity supported Standing balance-Leahy Scale: Fair Standing balance comment: reaching inside BOS                           ADL either performed or assessed with clinical judgement   ADL Overall ADL's : Needs assistance/impaired                                       General ADL Comments: MOD A for LB access seated EOC. MIN A + RW for ADL t/f. SBA + single UE support functional reach inside BOS                  Pertinent Vitals/Pain Pain Assessment: Faces Pain Score: 3  Faces Pain Scale: Hurts little more Pain Location: L hip incision Pain Descriptors / Indicators: Aching Pain Intervention(s): Limited activity within patient's tolerance;Repositioned     Hand Dominance Right   Extremity/Trunk Assessment Upper Extremity Assessment Upper Extremity Assessment: Overall WFL for tasks assessed   Lower Extremity Assessment Lower Extremity Assessment: Generalized weakness LLE Deficits / Details: Painful due to surgery. LLE Sensation: WNL LLE Coordination: WNL   Cervical / Trunk Assessment Cervical / Trunk Assessment: Normal   Communication Communication Communication: No difficulties   Cognition Arousal/Alertness: Awake/alert Behavior During Therapy: WFL for tasks assessed/performed Overall Cognitive Status: Within Functional Limits for tasks assessed  General Comments       Exercises Exercises: Other exercises Other Exercises Other Exercises: Pt educated re: OT role, DME recs, d/c recs, falls prevention, ECS, adapted dressing techniques Other Exercises: LBD, sit<>stand, fucntional reach, sitting/standing blaance/tolerance        Home Living Family/patient expects to be discharged to:: Private residence Living Arrangements: Alone Available Help at Discharge: Family;Available 24 hours/day (son 24/7  first week) Type of Home: House Home Access: Stairs to enter [employer] of Steps: 2 Entrance Stairs-Rails: Right Home Layout: One level     Bathroom Shower/Tub: Chief Strategy Officer: Standard Bathroom Accessibility: Yes   Home Equipment: Bedside commode;Grab bars - tub/shower;Shower seat;Cane - single point;Toilet riser   Additional Comments: Rented RW after R THA. Wishes to Rent RW again once D/c      Prior Functioning/Environment Level of Independence: Independent        Comments: Independent with all functional mobility, ADL's/IADLs.        OT Problem List: Decreased strength;Decreased range of motion;Decreased activity tolerance;Impaired balance (sitting and/or standing);Decreased safety awareness      OT Treatment/Interventions: Self-care/ADL training;Therapeutic exercise;Energy conservation;DME and/or AE instruction;Therapeutic activities;Patient/family education;Balance training    OT Goals(Current goals can be found in the care plan section) Acute Rehab OT Goals Patient Stated Goal: Go home OT Goal Formulation: With patient/family Time For Goal Achievement: 03/10/21 Potential to Achieve Goals: Good ADL Goals Pt Will Perform Grooming: with modified independence;standing (c LRAD PRN) Pt Will Perform Lower Body Dressing: with modified independence;sit to/from stand (c LRAD PRN) Pt Will Transfer to Toilet: with modified independence;ambulating;regular height toilet (c LRAD PRN)  OT Frequency: Min 1X/week    AM-PAC OT "6 Clicks" Daily Activity     Outcome Measure Help from another person eating meals?: None Help from another person taking care of personal grooming?: A Little Help from another person toileting, which includes using toliet, bedpan, or urinal?: A Little Help from another person bathing (including washing, rinsing, drying)?: A Little Help from another person to put on and taking off regular upper body clothing?: None Help  from another person to put on and taking off regular lower body clothing?: A Little 6 Click Score: 20   End of Session Equipment Utilized During Treatment: Rolling walker  Activity Tolerance: Patient tolerated treatment well Patient left: in bed;with call bell/phone within reach;with family/visitor present  OT Visit Diagnosis: Other abnormalities of gait and mobility (R26.89)                Time: 8891-6945 OT Time Calculation (min): 16 min Charges:  OT General Charges $OT Visit: 1 Visit OT Evaluation $OT Eval Low Complexity: 1 Low OT Treatments $Self Care/Home Management : 8-22 mins   Kathie Dike, M.S. OTR/L  02/24/21, 12:47 PM  ascom [phone]

## 2021-02-24 NOTE — Evaluation (Signed)
Physical Therapy Evaluation Patient Details Name: Jerome Cunningham MRN: [id number] DOB: Feb 06, 1956 Today's Date: 02/24/2021   History of Present Illness  Pt is a 65 y.o. male s/p L THA ant approach without any complications. PMH includes: DMII, HTN, obesity, sleep apnea.  Clinical Impression  Pt admitted with above diagnosis. Pt pleasant and agreeable to PT while supine in bed. While in supine pt performed LLE exercises requiring AAROM for heel slides and SLR due to surgical incision pain. Pt required increased time, HOB elevated and bed rail to t/f from supine to seated EOB with PT supervision. Mod-I with BRW STS with ability to march in place and weight shift side to side. Pt amb 30' in room with BRW with PT supervision and returned to recliner. Based off of current mobility and pt reports of 24/7 support from son for 1 week after Hospital D/c, PT anticipates at this time pt appropriate for current D/c recs. Pt is not at baseline mobility, but displays safe sequencing with transfers and safe ambulation with BRW. Pt currently with functional limitations due to the deficits listed below (see PT Problem List). Pt will benefit from skilled PT to increase their independence and safety with mobility to allow discharge to the venue listed below.        Follow Up Recommendations Home health PT;Supervision for mobility/OOB    Equipment Recommendations  Other (comment) (Bariatric RW)    Recommendations for Other Services       Precautions / Restrictions Precautions Precautions: Anterior Hip Precaution Booklet Issued: No Restrictions Weight Bearing Restrictions: Yes LLE Weight Bearing: Weight bearing as tolerated      Mobility  Bed Mobility Overal bed mobility: Needs Assistance Bed Mobility: Rolling;Sidelying to Sit;Supine to Sit Rolling: Modified independent (Device/Increase time) Sidelying to sit: Modified independent (Device/Increase time);HOB elevated Supine to sit: Supervision      General bed mobility comments: Required HOB elevated and use of bed rails in order to t/f from supine to seated EOB.    Transfers Overall transfer level: Needs assistance Equipment used:  (Bariatric RW) Transfers: Sit to/from Stand Sit to Stand: Modified independent (Device/Increase time)            Ambulation/Gait Ambulation/Gait assistance: Supervision Gait Distance (Feet): 25 Feet Assistive device:  (Bariatric RW) Gait Pattern/deviations: Step-through pattern;Decreased stance time - left     General Gait Details: Decreased cadence  Stairs            Wheelchair Mobility    Modified Rankin (Stroke Patients Only)       Balance Overall balance assessment: Needs assistance Sitting-balance support: No upper extremity supported Sitting balance-Leahy Scale: Good     Standing balance support: Bilateral upper extremity supported Standing balance-Leahy Scale: Fair Standing balance comment: Requires BRW                             Pertinent Vitals/Pain Pain Assessment: 0-10 Pain Score: 3  Pain Location: L hip incision Pain Descriptors / Indicators: Aching Pain Intervention(s): Premedicated before session;Repositioned;Monitored during session;Limited activity within patient's tolerance    Home Living Family/patient expects to be discharged to:: Private residence Living Arrangements: Alone Available Help at Discharge: Family;Available 24 hours/day (Son will be with him 1 week for 24/7 care after D/c) Type of Home: House Home Access: Stairs to enter Entrance Stairs-Rails: Right Entrance Stairs-Number of Steps: 2 Home Layout: One level Home Equipment: Bedside commode;Grab bars - tub/shower;Shower seat;Cane - single point;Toilet riser Additional Comments:  Rented RW after R THA. Wishes to Rent RW again once D/c    Prior Function Level of Independence: Independent         Comments: Independent with all functional mobility, ADL's/IADLs.      Hand Dominance   Dominant Hand: Right    Extremity/Trunk Assessment   Upper Extremity Assessment Upper Extremity Assessment: Overall WFL for tasks assessed    Lower Extremity Assessment Lower Extremity Assessment: Overall WFL for tasks assessed;Generalized weakness;LLE deficits/detail LLE Deficits / Details: Painful due to surgery. LLE Sensation: WNL LLE Coordination: WNL    Cervical / Trunk Assessment Cervical / Trunk Assessment: Normal  Communication   Communication: No difficulties  Cognition Arousal/Alertness: Awake/alert Behavior During Therapy: WFL for tasks assessed/performed Overall Cognitive Status: Within Functional Limits for tasks assessed                                        General Comments      Exercises Total Joint Exercises Ankle Circles/Pumps: AROM;Both;20 reps;Supine Quad Sets: AROM;Strengthening;Left;10 reps Heel Slides: AAROM;Strengthening;Left;10 reps Hip ABduction/ADduction: AROM;Strengthening;Left;10 reps Straight Leg Raises: AAROM;Strengthening;Left;10 reps Marching in Standing: AROM;Both;5 reps Other Exercises Other Exercises: Amb 30' in room with BRW   Assessment/Plan    PT Assessment Patient needs continued PT services  PT Problem List Decreased strength;Decreased mobility;Decreased range of motion;Obesity;Decreased activity tolerance;Pain       PT Treatment Interventions DME instruction;Therapeutic exercise;Gait training;Balance training;Stair training;Neuromuscular re-education;Functional mobility training;Therapeutic activities;Patient/family education    PT Goals (Current goals can be found in the Care Plan section)  Acute Rehab PT Goals Patient Stated Goal: Go home PT Goal Formulation: With patient Time For Goal Achievement: 02/26/21 Potential to Achieve Goals: Good    Frequency BID   Barriers to discharge        Co-evaluation               AM-PAC PT "6 Clicks" Mobility  Outcome Measure  Help needed turning from your back to your side while in a flat bed without using bedrails?: A Lot Help needed moving from lying on your back to sitting on the side of a flat bed without using bedrails?: A Little Help needed moving to and from a bed to a chair (including a wheelchair)?: A Little Help needed standing up from a chair using your arms (e.g., wheelchair or bedside chair)?: A Little Help needed to walk in hospital room?: A Little Help needed climbing 3-5 steps with a railing? : A Lot 6 Click Score: 16    End of Session Equipment Utilized During Treatment: Gait belt Activity Tolerance: Patient tolerated treatment well Patient left: in chair;with SCD's reapplied;with call bell/phone within reach Nurse Communication: Mobility status PT Visit Diagnosis: Pain;Other abnormalities of gait and mobility (R26.89);Muscle weakness (generalized) (M62.81);Difficulty in walking, not elsewhere classified (R26.2) Pain - Right/Left: Left Pain - part of body: Hip    Time: 0950-1010 PT Time Calculation (min) (ACUTE ONLY): 20 min   Charges:   PT Evaluation $PT Eval Low Complexity: 1 Low PT Treatments $Therapeutic Exercise: 8-22 mins         Tyller Bowlby M. Fairly IV, PT, DPT Physical Therapist- [place]  [facility]  02/24/2021, 11:08 AM

## 2021-02-24 NOTE — Progress Notes (Signed)
   Subjective: 1 Day Post-Op Procedure(s) (LRB): TOTAL HIP ARTHROPLASTY ANTERIOR APPROACH (Left) Patient reports pain as mild.   Patient is well, and has had no acute complaints or problems Denies any CP, SOB, ABD pain. We will continue therapy today.  Plan is to go Home after hospital stay.  Objective: Vital signs in last 24 hours: Temp:  [97 F (36.1 C)-98.2 F (36.8 C)] 98.1 F (36.7 C) (06/03 0802) Pulse Rate:  [76-110] 76 (06/03 0802) Resp:  [16-18] 18 (06/03 0802) BP: (105-140)/(61-90) 105/67 (06/03 0802) SpO2:  [92 %-97 %] 92 % (06/03 0802) Weight:  [165.7 kg] 165.7 kg (06/02 1047)  Intake/Output from previous day: 06/02 0701 - 06/03 0700 In: 2398.2 [P.O.:360; I.V.:1988.2; IV Piggyback:50] Out: 3900 [Urine:3400; Blood:500] Intake/Output this shift: No intake/output data recorded.  Recent Labs    02/23/21 1938 02/24/21 0548  HGB 12.9* 12.8*   Recent Labs    02/23/21 1938 02/24/21 0548  WBC 17.1* 10.7*  RBC 4.43 4.39  HCT 38.8* 37.2*  PLT 257 255   Recent Labs    02/23/21 1938 02/24/21 0548  NA  --  136  K  --  4.1  CL  --  107  CO2  --  23  BUN  --  14  CREATININE 0.83 0.72  GLUCOSE  --  128*  CALCIUM  --  8.5*   No results for input(s): LABPT, INR in the last 72 hours.  EXAM General - Patient is Alert, Appropriate and Oriented Extremity - Neurovascular intact Sensation intact distally Intact pulses distally Dorsiflexion/Plantar flexion intact Incision: dressing C/D/I and no drainage No cellulitis present Compartment soft Dressing - dressing C/D/I and no drainage.  Praveena intact no drainage Motor Function - intact, moving foot and toes well on exam.   Past Medical History:  Diagnosis Date  . Arthritis    hands, knees and hips  . COPD (chronic obstructive pulmonary disease) (HCC)    mild per dr. Mayo Ao  . Diabetes mellitus    diet control  . Diverticulitis   . History of kidney stones   . Hypercholesteremia   . Hypertension    . Inguinal hernia 01/2021   left  . Sleep apnea    uses CPAP  . Tobacco use disorder    attempting to cut down on his own    Assessment/Plan:   1 Day Post-Op Procedure(s) (LRB): TOTAL HIP ARTHROPLASTY ANTERIOR APPROACH (Left) Active Problems:   S/P hip replacement  Estimated body mass index is 48.86 kg/m as calculated from the following:   Height as of 02/13/21: 6' 0.5" (1.842 m).   Weight as of this encounter: 165.7 kg. Advance diet Up with therapy  Work on bowel movement Labs and vital signs are stable Pain well controlled Care management to assist with discharge to home with home health PT likely Saturday, 02/25/2021   DVT Prophylaxis - Lovenox, TED hose and SCDs Weight-Bearing as tolerated to left leg   T. Cranston Neighbor, PA-C [facility] 02/24/2021, 8:14 AM

## 2021-02-24 NOTE — TOC Progression Note (Signed)
Transition of Care [facility]) - Progression Note    Patient Details  Name: Jerome Cunningham MRN: [id number] Date of Birth: January 15, 1956  Transition of Care [facility]) CM/SW [place], RN Phone Number: 02/24/2021, 12:34 PM  Clinical Narrative:     Met with the patient and his son in the room, He is set up with [facility] for [place] and wants a RN as well, He needs a bariatric RW, notified [employer] to bring into the room for him to take home, he stated he would like to compare rental to purchase, He has transportation and can afford his medications       Expected Discharge Plan and Services                                                 Social Determinants of Health (SDOH) Interventions    Readmission Risk Interventions No flowsheet data found.

## 2021-02-25 LAB — CBC
HCT: 37.2 % — ABNORMAL LOW (ref 39.0–52.0)
Hemoglobin: 12.8 g/dL — ABNORMAL LOW (ref 13.0–17.0)
MCH: 29.1 pg (ref 26.0–34.0)
MCHC: 34.4 g/dL (ref 30.0–36.0)
MCV: 84.5 fL (ref 80.0–100.0)
Platelets: 245 10*3/uL (ref 150–400)
RBC: 4.4 MIL/uL (ref 4.22–5.81)
RDW: 13.6 % (ref 11.5–15.5)
WBC: 12.7 10*3/uL — ABNORMAL HIGH (ref 4.0–10.5)
nRBC: 0 % (ref 0.0–0.2)

## 2021-02-25 LAB — GLUCOSE, CAPILLARY
Glucose-Capillary: 143 mg/dL — ABNORMAL HIGH (ref 70–99)
Glucose-Capillary: 150 mg/dL — ABNORMAL HIGH (ref 70–99)

## 2021-02-25 MED ORDER — LACTULOSE 10 GM/15ML PO SOLN
20.0000 g | Freq: Every day | ORAL | Status: DC | PRN
  Administered 2021-02-25: 20 g via ORAL
  Filled 2021-02-25: qty 30

## 2021-02-25 NOTE — TOC Transition Note (Signed)
Transition of Care [facility]) - CM/SW Discharge Note   Patient Details  Name: Jerome Cunningham MRN: [id number] Date of Birth: 20-Sep-1956  Transition of Care [facility]) CM/SW Contact:  Luvenia Redden, RN Phone Number: [phone] 02/25/2021, 2:13 PM   Clinical Narrative:    Pt will be discharged to day with [facility], RN has contacted [facility] and left message on pt's discharge today. Also verified pt has a delivery of his bariatric Rolling Walker to his room and pt is using it accordingly. Pt is aware of his discharge today.  No other needs presented at this time.  Team updated with the above.    Final next level of care: Home w Home Health Services Barriers to Discharge: Barriers Resolved   Patient Goals and CMS Choice        Discharge Placement                    Patient and family notified of of transfer: 02/25/21  Discharge Plan and Services                DME Arranged: Dan Humphreys wide DME Agency: [employer] Date DME Agency Contacted: 02/25/21 Time DME Agency Contacted: [phone] Representative spoke with at DME Agency: Leavy Cella Verified delivery 6/3            Social Determinants of Health (SDOH) Interventions     Readmission Risk Interventions No flowsheet data found.

## 2021-02-25 NOTE — Progress Notes (Signed)
Subjective: 2 Days Post-Op Procedure(s) (LRB): TOTAL HIP ARTHROPLASTY ANTERIOR APPROACH (Left) Patient reports pain as mild.   Patient is well, and has had no acute complaints or problems Denies any CP, SOB, ABD pain. We will continue therapy today.  Plan is to go Home after hospital stay. Patient has not had a BM yet.  Patient completed stair training on POD1.  Objective: Vital signs in last 24 hours: Temp:  [98.1 F (36.7 C)-99.7 F (37.6 C)] 98.1 F (36.7 C) (06/04 0822) Pulse Rate:  [96-110] 96 (06/04 0822) Resp:  [16-19] 16 (06/04 0822) BP: (121-135)/(56-76) 134/75 (06/04 0822) SpO2:  [92 %-99 %] 94 % (06/04 0822)  Intake/Output from previous day: 06/03 0701 - 06/04 0700 In: -  Out: 3250 [Urine:3250] Intake/Output this shift: No intake/output data recorded.  Recent Labs    02/23/21 1938 02/24/21 0548 02/25/21 0451  HGB 12.9* 12.8* 12.8*   Recent Labs    02/24/21 0548 02/25/21 0451  WBC 10.7* 12.7*  RBC 4.39 4.40  HCT 37.2* 37.2*  PLT 255 245   Recent Labs    02/23/21 1938 02/24/21 0548  NA  --  136  K  --  4.1  CL  --  107  CO2  --  23  BUN  --  14  CREATININE 0.83 0.72  GLUCOSE  --  128*  CALCIUM  --  8.5*   No results for input(s): LABPT, INR in the last 72 hours.  EXAM General - Patient is Alert, Appropriate and Oriented Extremity - Neurovascular intact Sensation intact distally Intact pulses distally Dorsiflexion/Plantar flexion intact Incision: dressing C/D/I and no drainage No cellulitis present Compartment soft Dressing - dressing C/D/I and no drainage.  Praveena intact no drainage Motor Function - intact, moving foot and toes well on exam.   Past Medical History:  Diagnosis Date  . Arthritis    hands, knees and hips  . COPD (chronic obstructive pulmonary disease) (HCC)    mild per dr. Mayo Ao  . Diabetes mellitus    diet control  . Diverticulitis   . History of kidney stones   . Hypercholesteremia   . Hypertension   .  Inguinal hernia 01/2021   left  . Sleep apnea    uses CPAP  . Tobacco use disorder    attempting to cut down on his own   Assessment/Plan:   2 Days Post-Op Procedure(s) (LRB): TOTAL HIP ARTHROPLASTY ANTERIOR APPROACH (Left) Active Problems:   S/P hip replacement  Estimated body mass index is 48.86 kg/m as calculated from the following:   Height as of 02/13/21: 6' 0.5" (1.842 m).   Weight as of this encounter: 165.7 kg. Advance diet Up with therapy   Work on bowel movement, added lactulose to regimen today. Labs and vital signs are stable.  Patient was tachycardic last night, denies any chest pain, SOB or any issues this morning. Pain well controlled Plan for discharge home today following a BM.  DVT Prophylaxis - Lovenox, TED hose and SCDs Weight-Bearing as tolerated to left leg  J. Horris Latino, PA-C [facility] 02/25/2021, 8:58 AM

## 2021-02-25 NOTE — Plan of Care (Signed)
Patient discharged home per MD orders at this time.All discharge instructions,education and medications reviewed with patient.Pt expressed understanding and will comply with dc instructions.follow up appointments also communicated to patient.no verbal c/o or any ssx of distress.Pt discharged home with HH/PT services.patient transported home in a private car by son.

## 2021-02-28 LAB — SURGICAL PATHOLOGY

## 2021-06-06 ENCOUNTER — Other Ambulatory Visit: Payer: Self-pay | Admitting: Specialist

## 2021-06-06 ENCOUNTER — Other Ambulatory Visit (HOSPITAL_COMMUNITY): Payer: Self-pay | Admitting: Specialist

## 2021-06-06 DIAGNOSIS — J449 Chronic obstructive pulmonary disease, unspecified: Secondary | ICD-10-CM

## 2021-06-06 DIAGNOSIS — J310 Chronic rhinitis: Secondary | ICD-10-CM

## 2021-06-12 ENCOUNTER — Other Ambulatory Visit: Payer: Self-pay

## 2021-06-12 ENCOUNTER — Ambulatory Visit (HOSPITAL_BASED_OUTPATIENT_CLINIC_OR_DEPARTMENT_OTHER)
Admission: RE | Admit: 2021-06-12 | Discharge: 2021-06-12 | Disposition: A | Discharge status: Home / Self Care | Source: Ambulatory Visit | Attending: Specialist | Admitting: Specialist

## 2021-06-12 DIAGNOSIS — J449 Chronic obstructive pulmonary disease, unspecified: Secondary | ICD-10-CM | POA: Diagnosis present

## 2021-06-12 DIAGNOSIS — J310 Chronic rhinitis: Secondary | ICD-10-CM | POA: Diagnosis present

## 2021-08-25 ENCOUNTER — Other Ambulatory Visit: Payer: Self-pay

## 2021-08-25 ENCOUNTER — Other Ambulatory Visit: Payer: Self-pay | Admitting: Family Medicine

## 2021-08-25 ENCOUNTER — Ambulatory Visit (HOSPITAL_BASED_OUTPATIENT_CLINIC_OR_DEPARTMENT_OTHER)
Admission: RE | Admit: 2021-08-25 | Discharge: 2021-08-25 | Disposition: A | Discharge status: Home / Self Care | Source: Ambulatory Visit | Attending: Family Medicine | Admitting: Family Medicine

## 2021-08-25 ENCOUNTER — Ambulatory Visit

## 2021-08-25 ENCOUNTER — Other Ambulatory Visit (HOSPITAL_COMMUNITY): Payer: Self-pay | Admitting: Family Medicine

## 2021-08-25 DIAGNOSIS — M79605 Pain in left leg: Secondary | ICD-10-CM | POA: Diagnosis not present

## 2022-04-03 ENCOUNTER — Other Ambulatory Visit: Payer: Self-pay

## 2022-04-03 ENCOUNTER — Emergency Department (HOSPITAL_BASED_OUTPATIENT_CLINIC_OR_DEPARTMENT_OTHER): Admitting: Radiology

## 2022-04-03 ENCOUNTER — Emergency Department (HOSPITAL_BASED_OUTPATIENT_CLINIC_OR_DEPARTMENT_OTHER)
Admission: EM | Admit: 2022-04-03 | Discharge: 2022-04-03 | Disposition: A | Discharge status: Home / Self Care | Attending: Emergency Medicine | Admitting: Emergency Medicine

## 2022-04-03 ENCOUNTER — Encounter (HOSPITAL_BASED_OUTPATIENT_CLINIC_OR_DEPARTMENT_OTHER): Payer: Self-pay

## 2022-04-03 DIAGNOSIS — R5383 Other fatigue: Secondary | ICD-10-CM | POA: Insufficient documentation

## 2022-04-03 DIAGNOSIS — J449 Chronic obstructive pulmonary disease, unspecified: Secondary | ICD-10-CM | POA: Insufficient documentation

## 2022-04-03 DIAGNOSIS — R109 Unspecified abdominal pain: Secondary | ICD-10-CM

## 2022-04-03 DIAGNOSIS — N10 Acute tubulo-interstitial nephritis: Secondary | ICD-10-CM

## 2022-04-03 DIAGNOSIS — E119 Type 2 diabetes mellitus without complications: Secondary | ICD-10-CM | POA: Diagnosis not present

## 2022-04-03 DIAGNOSIS — Z79899 Other long term (current) drug therapy: Secondary | ICD-10-CM | POA: Insufficient documentation

## 2022-04-03 DIAGNOSIS — R509 Fever, unspecified: Secondary | ICD-10-CM | POA: Diagnosis not present

## 2022-04-03 DIAGNOSIS — Z20822 Contact with and (suspected) exposure to covid-19: Secondary | ICD-10-CM | POA: Diagnosis not present

## 2022-04-03 DIAGNOSIS — R2243 Localized swelling, mass and lump, lower limb, bilateral: Secondary | ICD-10-CM | POA: Insufficient documentation

## 2022-04-03 DIAGNOSIS — R519 Headache, unspecified: Secondary | ICD-10-CM | POA: Diagnosis not present

## 2022-04-03 DIAGNOSIS — R059 Cough, unspecified: Secondary | ICD-10-CM | POA: Diagnosis not present

## 2022-04-03 DIAGNOSIS — R0682 Tachypnea, not elsewhere classified: Secondary | ICD-10-CM | POA: Diagnosis not present

## 2022-04-03 DIAGNOSIS — R5381 Other malaise: Secondary | ICD-10-CM | POA: Diagnosis not present

## 2022-04-03 DIAGNOSIS — I10 Essential (primary) hypertension: Secondary | ICD-10-CM | POA: Insufficient documentation

## 2022-04-03 DIAGNOSIS — R10A2 Flank pain, left side: Secondary | ICD-10-CM

## 2022-04-03 DIAGNOSIS — R000 Tachycardia, unspecified: Secondary | ICD-10-CM | POA: Insufficient documentation

## 2022-04-03 DIAGNOSIS — R110 Nausea: Secondary | ICD-10-CM | POA: Insufficient documentation

## 2022-04-03 LAB — URINALYSIS, ROUTINE W REFLEX MICROSCOPIC
Bilirubin Urine: NEGATIVE
Glucose, UA: NEGATIVE mg/dL
Hgb urine dipstick: NEGATIVE
Ketones, ur: NEGATIVE mg/dL
Nitrite: POSITIVE — AB
Specific Gravity, Urine: 1.019 (ref 1.005–1.030)
pH: 5.5 (ref 5.0–8.0)

## 2022-04-03 LAB — CBC WITH DIFFERENTIAL/PLATELET
Abs Immature Granulocytes: 0.04 10*3/uL (ref 0.00–0.07)
Basophils Absolute: 0.1 10*3/uL (ref 0.0–0.1)
Basophils Relative: 0 %
Eosinophils Absolute: 0 10*3/uL (ref 0.0–0.5)
Eosinophils Relative: 0 %
HCT: 40.3 % (ref 39.0–52.0)
Hemoglobin: 13.9 g/dL (ref 13.0–17.0)
Immature Granulocytes: 0 %
Lymphocytes Relative: 9 %
Lymphs Abs: 1.3 10*3/uL (ref 0.7–4.0)
MCH: 29.6 pg (ref 26.0–34.0)
MCHC: 34.5 g/dL (ref 30.0–36.0)
MCV: 85.7 fL (ref 80.0–100.0)
Monocytes Absolute: 1.1 10*3/uL — ABNORMAL HIGH (ref 0.1–1.0)
Monocytes Relative: 7 %
Neutro Abs: 12.1 10*3/uL — ABNORMAL HIGH (ref 1.7–7.7)
Neutrophils Relative %: 84 %
Platelets: 275 10*3/uL (ref 150–400)
RBC: 4.7 MIL/uL (ref 4.22–5.81)
RDW: 14.2 % (ref 11.5–15.5)
WBC: 14.6 10*3/uL — ABNORMAL HIGH (ref 4.0–10.5)
nRBC: 0 % (ref 0.0–0.2)

## 2022-04-03 LAB — COMPREHENSIVE METABOLIC PANEL
ALT: 27 U/L (ref 0–44)
AST: 17 U/L (ref 15–41)
Albumin: 4.5 g/dL (ref 3.5–5.0)
Alkaline Phosphatase: 48 U/L (ref 38–126)
Anion gap: 12 (ref 5–15)
BUN: 20 mg/dL (ref 8–23)
CO2: 23 mmol/L (ref 22–32)
Calcium: 9.7 mg/dL (ref 8.9–10.3)
Chloride: 97 mmol/L — ABNORMAL LOW (ref 98–111)
Creatinine, Ser: 1 mg/dL (ref 0.61–1.24)
GFR, Estimated: >60 mL/min (ref >60)
Glucose, Bld: 158 mg/dL — ABNORMAL HIGH (ref 70–99)
Potassium: 3.6 mmol/L (ref 3.5–5.1)
Sodium: 132 mmol/L — ABNORMAL LOW (ref 135–145)
Total Bilirubin: 0.6 mg/dL (ref 0.3–1.2)
Total Protein: 7.6 g/dL (ref 6.5–8.1)

## 2022-04-03 LAB — LIPASE, BLOOD: Lipase: 18 U/L (ref 11–51)

## 2022-04-03 LAB — PROTIME-INR
INR: 1 (ref 0.8–1.2)
Prothrombin Time: 13.2 seconds (ref 11.4–15.2)

## 2022-04-03 LAB — LACTIC ACID, PLASMA: Lactic Acid, Venous: 1.5 mmol/L (ref 0.5–1.9)

## 2022-04-03 LAB — SARS CORONAVIRUS 2 BY RT PCR: SARS Coronavirus 2 by RT PCR: NEGATIVE

## 2022-04-03 MED ORDER — ACETAMINOPHEN 325 MG PO TABS
650.0000 mg | ORAL_TABLET | Freq: Once | ORAL | Status: AC
  Administered 2022-04-03: 650 mg via ORAL
  Filled 2022-04-03: qty 2

## 2022-04-03 MED ORDER — SODIUM CHLORIDE 0.9 % IV SOLN
1.0000 g | Freq: Once | INTRAVENOUS | Status: AC
  Administered 2022-04-03: 1 g via INTRAVENOUS
  Filled 2022-04-03: qty 10

## 2022-04-03 MED ORDER — CEPHALEXIN 500 MG PO CAPS: 500.0000 mg | ORAL_CAPSULE | Freq: Two times a day (BID) | ORAL | 0 refills | Status: AC

## 2022-04-03 NOTE — ED Triage Notes (Signed)
Pt reports fever, chills, left flank discomfort, fatigue, sinus congestion, and non productive cough since last night. Temp at home was around 103. States he took Excedrin this morning.

## 2022-04-03 NOTE — Discharge Instructions (Signed)
Your history, exam, work-up today are consistent with urinary tract infection and likely early pyelonephritis or kidney infection given the left flank discomfort in the setting of fever.  Your urine clearly showed infection and the rest of the work-up did not show evidence of kidney injury or liver abnormality.  Your vital signs improved since you were here.  We had a shared decision-making conversation offering admission for IV antibiotics however given your improvement in symptoms and well appearance otherwise, we agree with a dose of IV antibiotics and then discharged with oral antibiotics.  Pharmacy confirm the antibiotic regimen for you.  Please follow-up with your primary doctor.  If any symptoms change or worsen acutely, please return to the nearest emergency department as she would likely need admission and IV antibiotics at that time.

## 2022-04-03 NOTE — ED Provider Notes (Signed)
[facility] EMERGENCY DEPT Provider Note   CSN: [id number] Arrival date & time: 04/03/22  [id number]     History  Chief Complaint  Patient presents with   Fever   Cough   Headache   Flank Pain    Jerome Cunningham is a 66 y.o. male.  The history is provided by medical records and the patient. No language interpreter was used.  Fever Max temp prior to arrival:  103 Temp source:  Oral Severity:  Severe Onset quality:  Gradual Duration:  1 day Timing:  Constant Progression:  Waxing and waning Chronicity:  New Relieved by:  Nothing Worsened by:  Nothing Ineffective treatments: excedtin. Associated symptoms: chills, cough, headaches and nausea   Associated symptoms: no chest pain, no confusion, no congestion, no diarrhea, no dysuria, no rash, no rhinorrhea, no somnolence and no vomiting   Cough Cough characteristics:  Non-productive Severity:  Mild Onset quality:  Gradual Timing:  Intermittent Progression:  Waxing and waning Chronicity:  New Relieved by:  Nothing Worsened by:  Nothing Associated symptoms: chills, diaphoresis, fever and headaches   Associated symptoms: no chest pain, no rash, no rhinorrhea, no shortness of breath and no wheezing   Headache Associated symptoms: back pain (l flank low), cough, fatigue, fever and nausea   Associated symptoms: no abdominal pain, no congestion, no diarrhea, no dizziness, no neck pain, no neck stiffness, no numbness, no seizures, no vomiting and no weakness   Flank Pain Associated symptoms include headaches. Pertinent negatives include no chest pain, no abdominal pain and no shortness of breath.       Home Medications Prior to Admission medications   Medication Sig Start Date End Date Taking? Authorizing Provider  docusate sodium (COLACE) 100 MG capsule Take 1 capsule (100 mg total) by mouth 2 (two) times daily. 02/24/21   Duanne Guess, PA-C  Dulaglutide (TRULICITY) [id number] [id number] SOPN Inject 0.75 mg into  the skin every Thursday.    [provider]  enoxaparin (LOVENOX) 40 MG/0.4ML injection Inject 0.4 mLs (40 mg total) into the skin daily for 14 days. 02/24/21 03/10/21  Duanne Guess, PA-C  hydrochlorothiazide (MICROZIDE) 12.5 MG capsule Take 12.5 mg by mouth daily.    [provider]  lovastatin (MEVACOR) 20 MG tablet Take 20 mg by mouth at bedtime.    [provider]  metFORMIN (GLUCOPHAGE) 1000 MG tablet Take 1,000 mg by mouth 2 (two) times daily with a meal. 09/11/17   [provider]  methocarbamol (ROBAXIN) 500 MG tablet Take 1 tablet (500 mg total) by mouth every 6 (six) hours as needed for muscle spasms. 02/24/21   Duanne Guess, PA-C  oxyCODONE (OXY IR/ROXICODONE) 5 MG immediate release tablet Take 1-2 tablets (5-10 mg total) by mouth every 4 (four) hours as needed for moderate pain (pain score 4-6). 02/24/21   Duanne Guess, PA-C  telmisartan (MICARDIS) 80 MG tablet Take 80 mg by mouth daily.    [provider]      Allergies    Tramadol    Review of Systems   Review of Systems  Constitutional:  Positive for chills, diaphoresis, fatigue and fever.  HENT:  Negative for congestion and rhinorrhea.   Eyes:  Negative for visual disturbance.  Respiratory:  Positive for cough. Negative for chest tightness, shortness of breath and wheezing.   Cardiovascular:  Positive for leg swelling (chronic an dinchanged per pt). Negative for chest pain and palpitations.  Gastrointestinal:  Positive for nausea. Negative for abdominal  distention, abdominal pain, constipation, diarrhea and vomiting.  Genitourinary:  Positive for flank pain. Negative for dysuria, frequency and hematuria.  Musculoskeletal:  Positive for back pain (l flank low). Negative for neck pain and neck stiffness.  Skin:  Negative for rash and wound.  Neurological:  Positive for headaches. Negative for dizziness, seizures, weakness, light-headedness and numbness.  Psychiatric/Behavioral:   Negative for agitation and confusion.   All other systems reviewed and are negative.   Physical Exam Updated Vital Signs BP 133/73   Pulse (!) 115   Temp (!) 102.2 F (39 C)   Resp (!) 21   Ht 6' 0.5" (1.842 m)   Wt (!) 158.8 kg   SpO2 95%   BMI 46.82 kg/m  Physical Exam Vitals and nursing note reviewed.  Constitutional:      General: He is not in acute distress.    Appearance: He is well-developed. He is not ill-appearing, toxic-appearing or diaphoretic.  HENT:     Head: Normocephalic and atraumatic.     Nose: No congestion or rhinorrhea.     Mouth/Throat:     Mouth: Mucous membranes are moist.     Pharynx: No oropharyngeal exudate or posterior oropharyngeal erythema.  Eyes:     Extraocular Movements: Extraocular movements intact.     Conjunctiva/sclera: Conjunctivae normal.     Pupils: Pupils are equal, round, and reactive to light.  Cardiovascular:     Rate and Rhythm: Regular rhythm. Tachycardia present.     Heart sounds: No murmur heard. Pulmonary:     Effort: Pulmonary effort is normal. No respiratory distress.     Breath sounds: Rhonchi present. No wheezing or rales.  Chest:     Chest wall: No tenderness.  Abdominal:     General: There is no distension.     Palpations: Abdomen is soft.     Tenderness: There is no abdominal tenderness. There is no right CVA tenderness, left CVA tenderness, guarding or rebound.  Musculoskeletal:        General: No swelling or tenderness.     Cervical back: Neck supple. No tenderness.     Right lower leg: Edema present.     Left lower leg: Edema present.  Skin:    General: Skin is warm and dry.     Capillary Refill: Capillary refill takes less than 2 seconds.     Findings: No erythema or rash.  Neurological:     General: No focal deficit present.     Mental Status: He is alert.  Psychiatric:        Mood and Affect: Mood normal. Mood is not anxious.     ED Results / Procedures / Treatments   Labs (all labs ordered are  listed, but only abnormal results are displayed) Labs Reviewed  COMPREHENSIVE METABOLIC PANEL - Abnormal; Notable for the following components:      Result Value   Sodium 132 (*)    Chloride 97 (*)    Glucose, Bld 158 (*)    All other components within normal limits  CBC WITH DIFFERENTIAL/PLATELET - Abnormal; Notable for the following components:   WBC 14.6 (*)    Neutro Abs 12.1 (*)    Monocytes Absolute 1.1 (*)    All other components within normal limits  URINALYSIS, ROUTINE W REFLEX MICROSCOPIC - Abnormal; Notable for the following components:   Protein, ur TRACE (*)    Nitrite POSITIVE (*)    Leukocytes,Ua SMALL (*)    Bacteria, UA MANY (*)  All other components within normal limits  SARS CORONAVIRUS 2 BY RT PCR  CULTURE, BLOOD (ROUTINE X 2)  CULTURE, BLOOD (ROUTINE X 2)  URINE CULTURE  LACTIC ACID, PLASMA  PROTIME-INR  LIPASE, BLOOD  LACTIC ACID, PLASMA    EKG None  Radiology DG Chest 2 View  Result Date: 04/03/2022 CLINICAL DATA:  66 year old male with cough and fever. Possible sepsis. EXAM: CHEST - 2 VIEW COMPARISON:  Chest CT 06/12/2021 and earlier. FINDINGS: PA and lateral views of the chest. Normal lung volumes and mediastinal contours. Visualized tracheal air column is within normal limits. No pneumothorax, pulmonary edema, pleural effusion or confluent pulmonary opacity. No acute osseous abnormality identified. Negative visible bowel gas. IMPRESSION: No acute cardiopulmonary abnormality. Electronically Signed   By: Genevie Ann M.D.   On: 04/03/2022 10:37    Procedures Procedures    Medications Ordered in ED Medications  cefTRIAXone (ROCEPHIN) 1 g in sodium chloride 0.9 % 100 mL IVPB (has no administration in time range)    ED Course/ Medical Decision Making/ A&P                           Medical Decision Making Amount and/or Complexity of Data Reviewed Labs: ordered. Radiology: ordered.    Jadarian Hutson is a 66 y.o. male with a past medical  history significant for hypertension, diabetes, previous diverticulitis, COPD, inguinal hernia, previous kidney stones, previous ESBL UTI, hypercholesterolemia, and previous sepsis from urinary source who presents with 1 day of fevers, chills, dry cough, nausea, fatigue, malaise, and some left flank discomfort.  According to patient, since yesterday he has been having all the symptoms including the fevers up to 103 overnight.  He reports he had some nausea and vomiting.  Denies constipation or diarrhea.  He denies any urinary changes but does say he has had some intermittent left flank discomfort that was mild.  He reports it being present now.  He does say that he has been septic several times from infections in the past and he wanted to make sure that is not going on.  He said it was from a urinary source previously.  He reports some cough but denies significant chest pain or shortness of breath.  He denies any trauma.  Reported some chronic sinus discomfort but is not complaining of it significantly now.  On exam, lungs did have some coarseness.  Chest and abdomen nontender.  Flank nontender.  Back nontender.  No rash seen.  Patient denied tick exposures.  Patient otherwise well-appearing.  He does have edema in his legs bilaterally that he reports is unchanged from baseline as he takes fluid pills.  Patient's vital signs on arrival did show tachycardia, tachypnea, and fever up to 102.2.  He is not hypoxic with oxygen saturations did dip to about 92% during conversation with him.  We will ambulate to make sure he is not hypoxic with ambulation.  As he is not hypotensive, he has edema in his legs, and oxygen saturations are just above the 80s, we agreed to hold on significant fluid resuscitation initially so as not to cause hypoxia from fluid overload given his report of Lasix use.  We together agreed to get labs initially, x-ray, and urine and then together make a decision on management.  2:01  PM Work-up continues to return.  X-ray does not show pneumonia.  COVID-negative.  Patient does have a mild leukocytosis similar to what he had in the past his  metabolic panel shows mild hyponatremia and hypochloremia but his kidney function and liver function are normal.  He is not anemic.  Lactic acid initially normal.  Urinalysis shows evidence of significant urinary tract infection.  Clinically I suspect patient has UTI/early pyelonephritis with his intermittent left flank pain.  Given his report that he has been septic in the past, we offered admission for IV antibiotics and monitoring however he would like to go home if possible.  His vital signs have improved, he is not tachycardic, hypotensive, tachypneic, or hypoxic.  His fever has improved as well.  Patient would rather go home with antibiotics and return if any symptoms worsen.  He understands that if he returns he would likely admission for IV antibiotics.    Patient agrees to get a dose of Rocephin here and then Keflex based on cultures.  I confirmed this antibiotic plan with pharmacy who reviewed the previous cultures with me as he does have a history of ESBL in the chart we could not see evidence of any recent episodes.  Pharmacy recommended the Keflex for outpatient antibiotic therapy  We will give Rocephin and then have him discharged with Keflex with good return precautions and strict follow-up instructions.        Final Clinical Impression(s) / ED Diagnoses Final diagnoses:  Fever, unspecified fever cause  Acute pyelonephritis  Left flank pain  Cough, unspecified type    Rx / DC Orders ED Discharge Orders          Ordered    cephALEXin (KEFLEX) 500 MG capsule  2 times daily        04/03/22 1405            Clinical Impression: 1. Fever, unspecified fever cause   2. Acute pyelonephritis   3. Left flank pain   4. Cough, unspecified type     Disposition: Discharge  Condition: Good  I have discussed the  results, Dx and Tx plan with the pt(& family if present). He/she/they expressed understanding and agree(s) with the plan. Discharge instructions discussed at great length. Strict return precautions discussed and pt &/or family have verbalized understanding of the instructions. No further questions at time of discharge.    New Prescriptions   CEPHALEXIN (KEFLEX) 500 MG CAPSULE    Take 1 capsule (500 mg total) by mouth 2 (two) times daily for 14 days.    Follow Up: Marisue Ivan, MD [phone] [facility] [address] [facility] [place] [address] [phone]     [facility] [address] [place] [address] [phone]       Rishan Oyama, Canary Brim, MD 04/03/22 1407

## 2022-04-06 LAB — URINE CULTURE: Culture: >=100000 — AB

## 2022-04-07 ENCOUNTER — Telehealth: Payer: Self-pay | Admitting: *Deleted

## 2022-04-07 NOTE — Telephone Encounter (Signed)
Post ED Visit - Positive Culture Follow-up: Unsuccessful Patient Follow-up  Culture assessed and recommendations reviewed by:  []  , Pharm.D. []  Enzo Bi, Pharm.D., BCPS AQ-ID []  , Pharm.D., BCPS []  Celedonio Miyamoto, Pharm.D., BCPS []  [place], Garvin Fila.D., BCPS, AAHIVP []  , Pharm.D., BCPS, AAHIVP []  Georgina Pillion, PharmD []  , PharmD, BCPS  Positive urine culture  []  Patient discharged without antimicrobial prescription and treatment is now indicated [x]  Organism is resistant to prescribed ED discharge antimicrobial []  Patient with positive blood cultures Plan:   Stop Cephalexin.  Start Bactrim DS 1 tab PO BID x 14 days, [place] MD.  Unable to contact patient after 3 attempts, letter will be sent to address on file  [address] 04/07/2022, 1:12 PM

## 2022-04-07 NOTE — Progress Notes (Signed)
ED Antimicrobial Stewardship Positive Culture Follow Up   Jerome Cunningham is an 66 y.o. male who presented to [facility] on 04/03/2022 with a chief complaint of  Chief Complaint  Patient presents with   Fever   Cough   Headache   Flank Pain    Recent Results (from the past 720 hour(s))  Culture, blood (Routine x 2)     Status: None (Preliminary result)   Collection Time: 04/03/22 10:00 AM   Specimen: Right Antecubital; Blood  Result Value Ref Range Status   Specimen Description   Final    RIGHT ANTECUBITAL Performed at [facility], [address] [place], [place] [address]    Special Requests   Final    BOTTLES DRAWN AEROBIC AND ANAEROBIC Blood Culture adequate volume Performed at [facility], [address] [place], [place] [address]    Culture   Final    NO GROWTH 4 DAYS Performed at [facility], [address] [place], [place] [address]    Report Status PENDING  Incomplete  Culture, blood (Routine x 2)     Status: None (Preliminary result)   Collection Time: 04/03/22 10:00 AM   Specimen: BLOOD LEFT HAND  Result Value Ref Range Status   Specimen Description   Final    BLOOD LEFT HAND Performed at [facility], [address] [place], [place] [address]    Special Requests   Final    BOTTLES DRAWN AEROBIC AND ANAEROBIC Blood Culture adequate volume Performed at [facility], [address], [place], [place] [address]    Culture   Final    NO GROWTH 4 DAYS Performed at [facility], [address], [place], [place] [address]    Report Status PENDING  Incomplete  SARS Coronavirus 2 by RT PCR (hospital order, performed in [facility] hospital lab) *cepheid single result test* Anterior Nasal Swab     Status: None   Collection Time: 04/03/22 10:30 AM   Specimen: Anterior Nasal Swab  Result Value Ref Range Status   SARS Coronavirus 2 by RT PCR NEGATIVE NEGATIVE Final     Comment: (NOTE) SARS-CoV-2 target nucleic acids are NOT DETECTED.  The SARS-CoV-2 RNA is generally detectable in upper and lower respiratory specimens during the acute phase of infection. The lowest concentration of SARS-CoV-2 viral copies this assay can detect is 250 copies / mL. A negative result does not preclude SARS-CoV-2 infection and should not be used as the sole basis for treatment or other patient management decisions.  A negative result may occur with improper specimen collection / handling, submission of specimen other than nasopharyngeal swab, presence of viral mutation(s) within the areas targeted by this assay, and inadequate number of viral copies (<250 copies / mL). A negative result must be combined with clinical observations, patient history, and epidemiological information.  Fact Sheet for Patients:   RoadLapTop.co.za  Fact Sheet for Healthcare Providers: http://kim-miller.com/  This test is not yet approved or  cleared by the [place] FDA and has been authorized for detection and/or diagnosis of SARS-CoV-2 by FDA under an Emergency Use Authorization (EUA).  This EUA will remain in effect (meaning this test can be used) for the duration of the COVID-19 declaration under Section 564(b)(1) of the Act, 21 U.S.C. section 360bbb-3(b)(1), unless the authorization is terminated or revoked sooner.  Performed at [facility], [address], [place], [place] [address]   Urine Culture     Status:  Abnormal   Collection Time: 04/03/22  1:16 PM   Specimen: Urine, Clean Catch  Result Value Ref Range Status   Specimen Description   Final    URINE, CLEAN CATCH Performed at [facility], [address] [place], [place] [address]    Special Requests   Final    NONE Performed at [facility], [address], [place], [place] [address]    Culture >=100,000  COLONIES/mL ESCHERICHIA COLI (A)  Final   Report Status 04/06/2022 FINAL  Final   Organism ID, Bacteria ESCHERICHIA COLI (A)  Final      Susceptibility   Escherichia coli - MIC*    AMPICILLIN >=32 RESISTANT Resistant     CEFAZOLIN >=64 RESISTANT Resistant     CEFEPIME <=0.12 SENSITIVE Sensitive     CEFTRIAXONE <=0.25 SENSITIVE Sensitive     CIPROFLOXACIN >=4 RESISTANT Resistant     GENTAMICIN <=1 SENSITIVE Sensitive     IMIPENEM <=0.25 SENSITIVE Sensitive     NITROFURANTOIN <=16 SENSITIVE Sensitive     TRIMETH/SULFA <=20 SENSITIVE Sensitive     AMPICILLIN/SULBACTAM >=32 RESISTANT Resistant     PIP/TAZO 64 INTERMEDIATE Intermediate     * >=100,000 COLONIES/mL ESCHERICHIA COLI    [x]  Treated with Keflex, organism resistant to prescribed antimicrobial  New antibiotic prescription: Bactrim 1 DS tablet BID x 14 days (Qty 28; Refills 0)  ED Provider: , PharmD, BCPS 04/07/2022 11:32 AM ED Clinical Pharmacist -  [phone]

## 2022-04-08 LAB — CULTURE, BLOOD (ROUTINE X 2)
Culture: NO GROWTH
Culture: NO GROWTH
Special Requests: ADEQUATE
Special Requests: ADEQUATE

## 2022-07-25 ENCOUNTER — Other Ambulatory Visit: Payer: Self-pay | Admitting: Family Medicine

## 2022-07-25 DIAGNOSIS — I10 Essential (primary) hypertension: Secondary | ICD-10-CM

## 2022-07-25 DIAGNOSIS — F17200 Nicotine dependence, unspecified, uncomplicated: Secondary | ICD-10-CM

## 2022-07-25 DIAGNOSIS — E7800 Pure hypercholesterolemia, unspecified: Secondary | ICD-10-CM

## 2022-07-25 DIAGNOSIS — Z9189 Other specified personal risk factors, not elsewhere classified: Secondary | ICD-10-CM

## 2022-09-03 ENCOUNTER — Other Ambulatory Visit

## 2022-09-10 ENCOUNTER — Ambulatory Visit
Admission: RE | Admit: 2022-09-10 | Discharge: 2022-09-10 | Disposition: A | Discharge status: Home / Self Care | Payer: Medicare Other | Source: Ambulatory Visit | Attending: Family Medicine | Admitting: Family Medicine

## 2022-09-10 DIAGNOSIS — F17200 Nicotine dependence, unspecified, uncomplicated: Secondary | ICD-10-CM

## 2022-09-10 DIAGNOSIS — E7800 Pure hypercholesterolemia, unspecified: Secondary | ICD-10-CM

## 2022-09-10 DIAGNOSIS — Z9189 Other specified personal risk factors, not elsewhere classified: Secondary | ICD-10-CM

## 2022-09-10 DIAGNOSIS — I10 Essential (primary) hypertension: Secondary | ICD-10-CM

## 2023-05-29 IMAGING — US US EXTREM LOW VENOUS*L*
1 series · 13 of 24 positions shown · non-contrast
Comparison: None.

CLINICAL DATA: Left leg redness and swelling



[Series 1: us venous img lower uni left (dvt) · portal-venous · 13 of 46 slices shown]
[im 1/46]
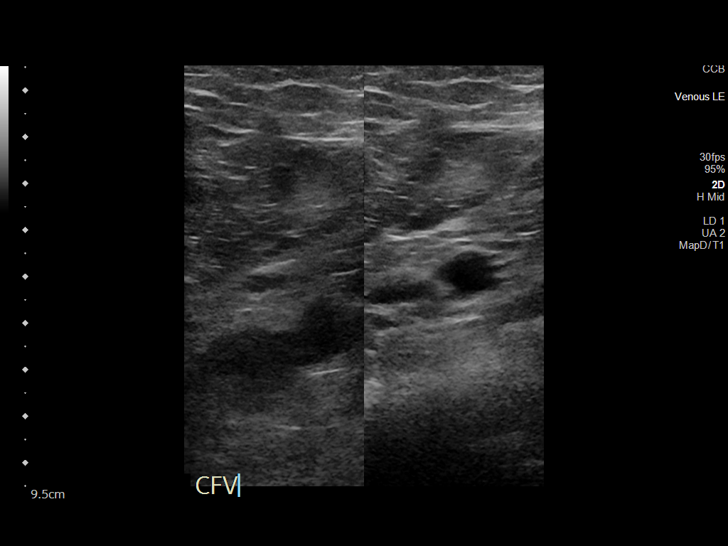
[im 4/46]
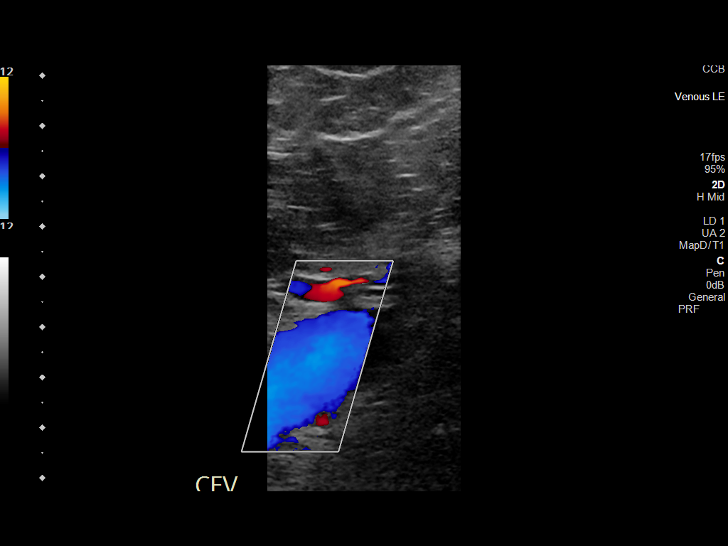
[im 8/46]
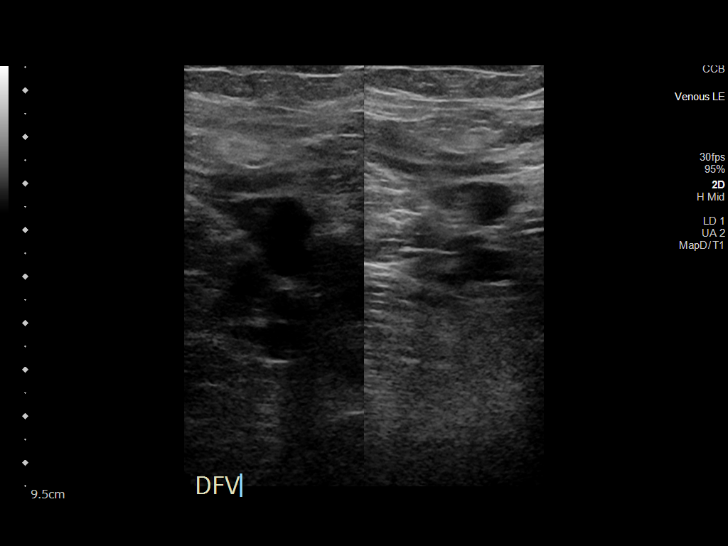
[im 12/46]
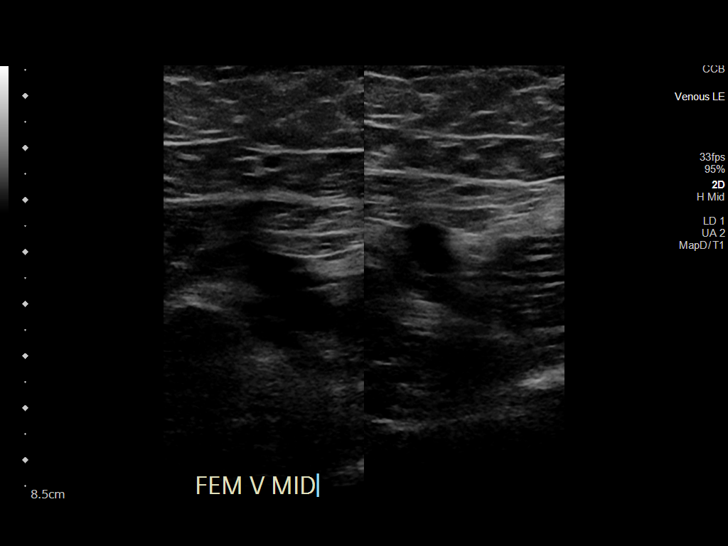
[im 16/46]
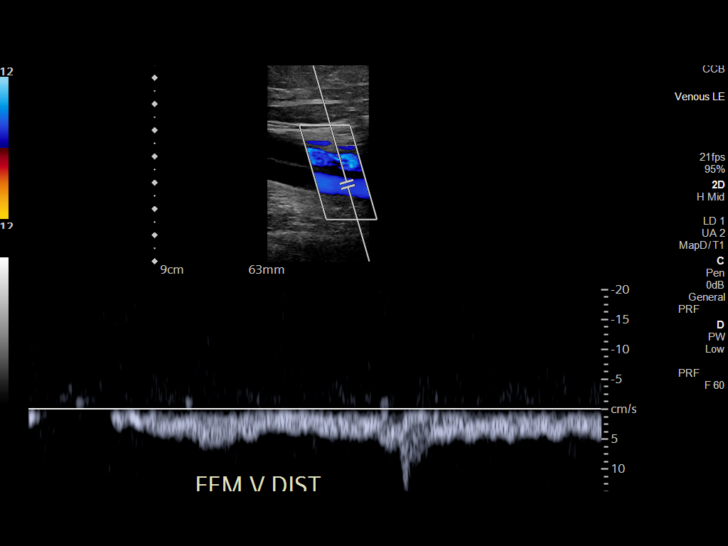
[im 20/46]
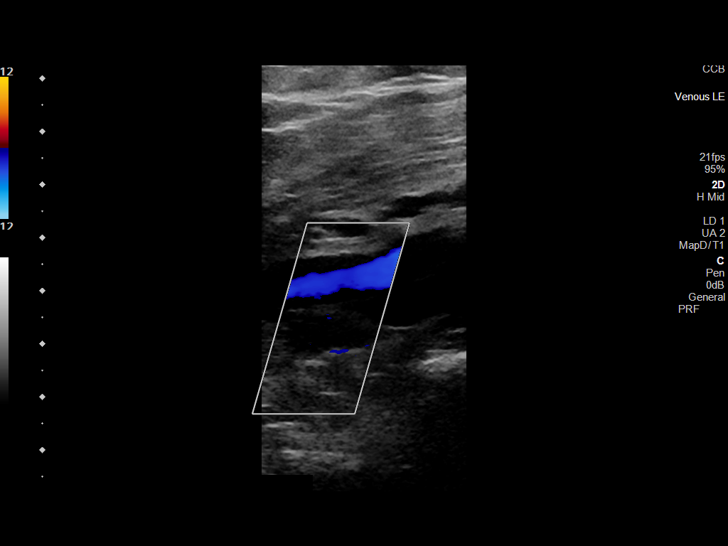
[im 24/46]
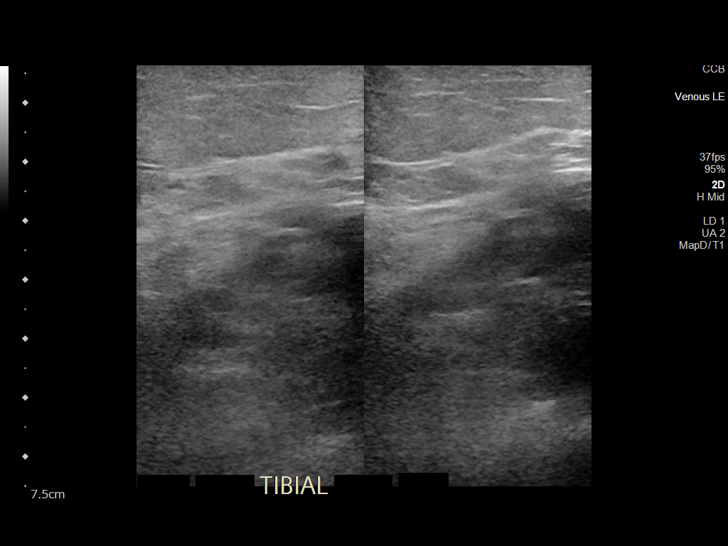
[im 26/46]
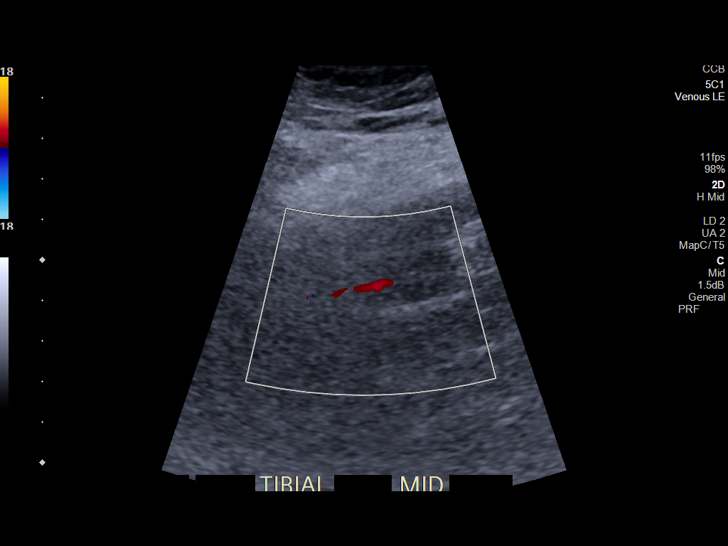
[im 30/46]
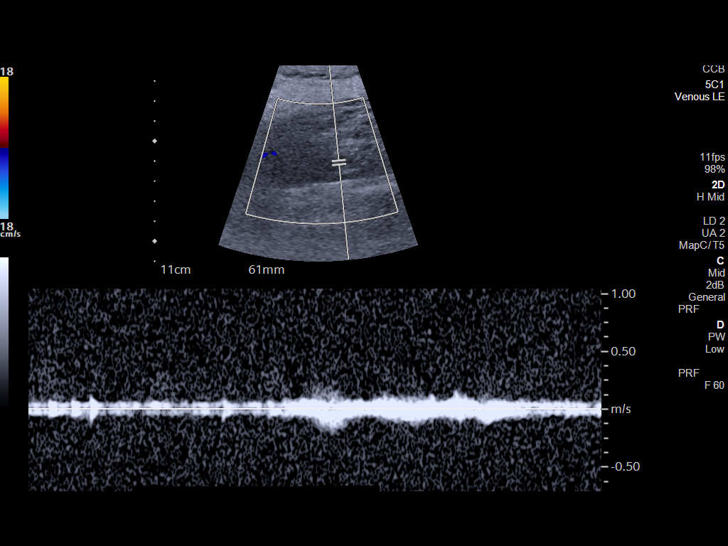
[im 34/46]
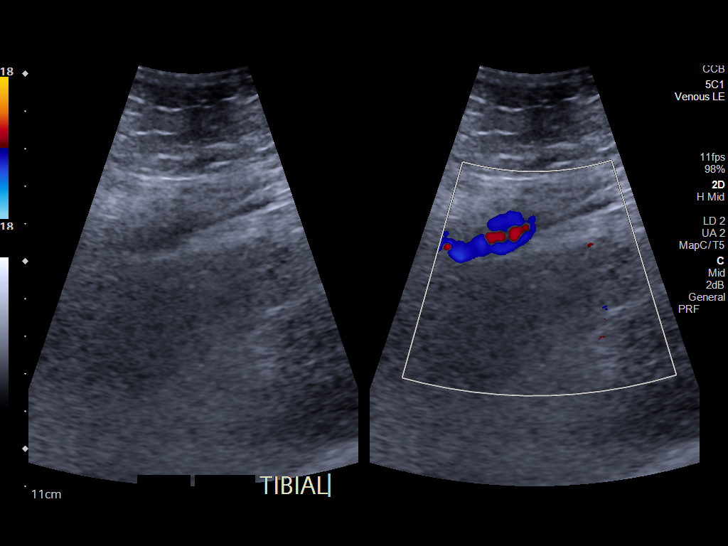
[im 38/46]
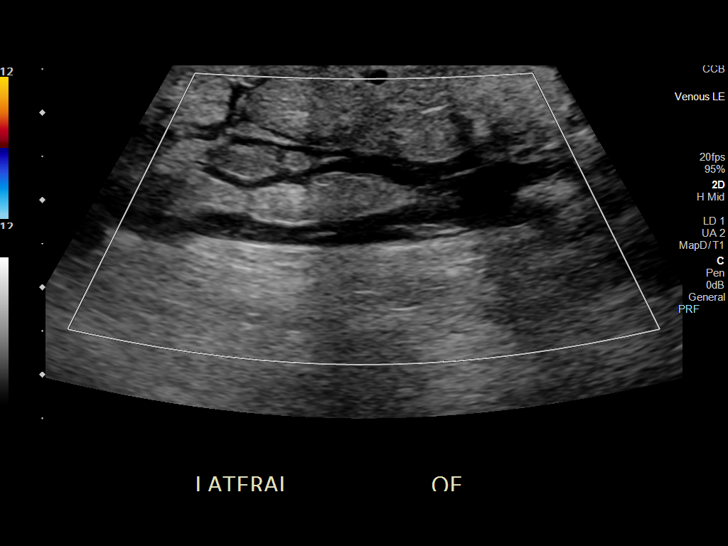
[im 42/46]
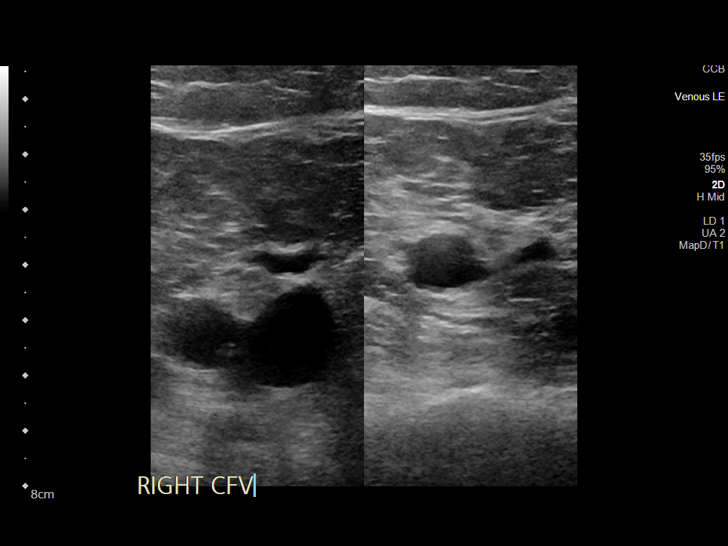
[im 46/46]
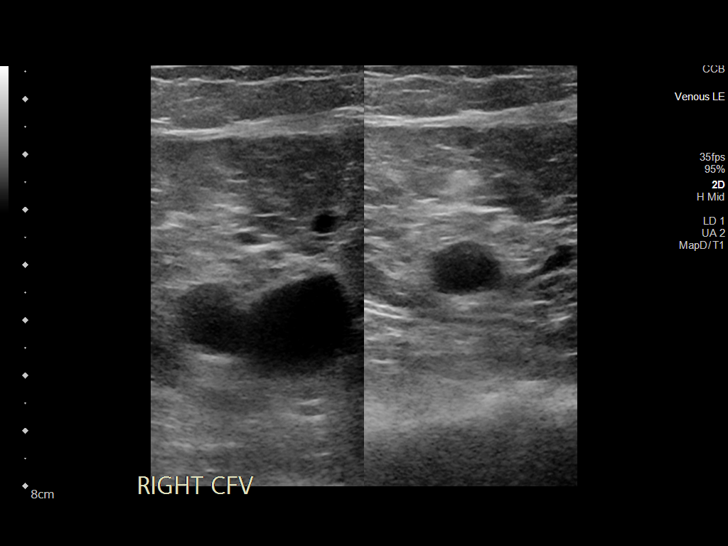

[13 of 24 positions shown; findings below may reference images not displayed]

FINDINGS: Contralateral Common Femoral Vein: Respiratory phasicity is normal
and symmetric with the symptomatic side. No evidence of thrombus.
Normal compressibility.

Common Femoral Vein: No evidence of thrombus. Normal
compressibility, respiratory phasicity and response to augmentation.

Saphenofemoral Junction: No evidence of thrombus. Normal
compressibility and flow on color Doppler imaging.

Profunda Femoral Vein: No evidence of thrombus. Normal
compressibility and flow on color Doppler imaging.

Femoral Vein: No evidence of thrombus. Normal compressibility,
respiratory phasicity and response to augmentation.

Popliteal Vein: No evidence of thrombus. Normal compressibility,
respiratory phasicity and response to augmentation.

Calf Veins: Limited visualization because of peripheral edema. No
large acute thrombus. Tibial and peroneal veins appear patent.

Other Findings:  Lower extremity subcutaneous edema.
IMPRESSION: Limited exam.  No significant left lower extremity DVT.

Peripheral subcutaneous edema.
